# Patient Record
Sex: Male | Born: 1945 | Race: White | Hispanic: No | Smoking: Former smoker
Health system: Southern US, Community
[De-identification: ages and names within clinical notes are randomized; demographics above are authoritative.]

## PROBLEM LIST (undated history)

## (undated) DIAGNOSIS — H269 Unspecified cataract: Secondary | ICD-10-CM

## (undated) DIAGNOSIS — G4730 Sleep apnea, unspecified: Secondary | ICD-10-CM

## (undated) DIAGNOSIS — T884XXA Failed or difficult intubation, initial encounter: Secondary | ICD-10-CM

## (undated) DIAGNOSIS — K319 Disease of stomach and duodenum, unspecified: Secondary | ICD-10-CM

## (undated) DIAGNOSIS — Z5189 Encounter for other specified aftercare: Secondary | ICD-10-CM

## (undated) DIAGNOSIS — I10 Essential (primary) hypertension: Secondary | ICD-10-CM

## (undated) DIAGNOSIS — K5790 Diverticulosis of intestine, part unspecified, without perforation or abscess without bleeding: Secondary | ICD-10-CM

## (undated) DIAGNOSIS — A809 Acute poliomyelitis, unspecified: Secondary | ICD-10-CM

## (undated) DIAGNOSIS — I639 Cerebral infarction, unspecified: Secondary | ICD-10-CM

## (undated) HISTORY — DX: Cerebral infarction, unspecified: I63.9

## (undated) HISTORY — DX: Encounter for other specified aftercare: Z51.89

## (undated) HISTORY — DX: Essential (primary) hypertension: I10

## (undated) HISTORY — PX: KNEE ARTHROSCOPY: SUR90

## (undated) HISTORY — DX: Acute poliomyelitis, unspecified: A80.9

## (undated) HISTORY — PX: COLONOSCOPY: SHX174

## (undated) HISTORY — PX: FRACTURE SURGERY: SHX138

## (undated) HISTORY — DX: Unspecified cataract: H26.9

## (undated) HISTORY — DX: Failed or difficult intubation, initial encounter: T88.4XXA

## (undated) HISTORY — DX: Disease of stomach and duodenum, unspecified: K31.9

## (undated) HISTORY — DX: Diverticulosis of intestine, part unspecified, without perforation or abscess without bleeding: K57.90

## (undated) HISTORY — PX: EYE SURGERY: SHX253

## (undated) HISTORY — DX: Sleep apnea, unspecified: G47.30

## (undated) SURGERY — Surgical Case
Anesthesia: *Unknown

---

## 1998-08-29 ENCOUNTER — Ambulatory Visit (HOSPITAL_COMMUNITY): Admission: RE | Admit: 1998-08-29 | Discharge: 1998-08-29 | Payer: Self-pay | Admitting: Gastroenterology

## 2008-05-17 HISTORY — PX: COLONOSCOPY: SHX174

## 2011-03-18 ENCOUNTER — Other Ambulatory Visit: Payer: Self-pay | Admitting: Cardiology

## 2011-03-18 ENCOUNTER — Encounter: Payer: Self-pay | Admitting: Cardiology

## 2011-05-04 ENCOUNTER — Encounter: Payer: Self-pay | Admitting: Cardiology

## 2011-05-04 ENCOUNTER — Encounter: Payer: Self-pay | Admitting: Cardiovascular Disease

## 2011-05-04 ENCOUNTER — Encounter: Payer: Self-pay | Admitting: *Deleted

## 2011-05-05 ENCOUNTER — Ambulatory Visit (INDEPENDENT_AMBULATORY_CARE_PROVIDER_SITE_OTHER): Payer: BC Managed Care – PPO | Admitting: Cardiology

## 2011-05-05 ENCOUNTER — Encounter: Payer: Self-pay | Admitting: Cardiology

## 2011-05-05 VITALS — BP 142/81 | HR 53 | Wt 237.0 lb

## 2011-05-05 DIAGNOSIS — I10 Essential (primary) hypertension: Secondary | ICD-10-CM

## 2011-05-05 DIAGNOSIS — R42 Dizziness and giddiness: Secondary | ICD-10-CM | POA: Insufficient documentation

## 2011-05-05 DIAGNOSIS — R55 Syncope and collapse: Secondary | ICD-10-CM
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
## 2011-05-05 NOTE — Patient Instructions (Signed)
Your physician recommends that you schedule a follow-up appointment in: 4-6 WEEKS  Your physician has requested that you have an echocardiogram. Echocardiography is a painless test that uses sound waves to create images of your heart. It provides your doctor with information about the size and shape of your heart and how well your heart's chambers and valves are working. This procedure takes approximately one hour. There are no restrictions for this procedure.   Your physician has recommended that you wear an event monitor. Event monitors are medical devices that record the heart's electrical activity. Doctors most often us these monitors to diagnose arrhythmias. Arrhythmias are problems with the speed or rhythm of the heartbeat. The monitor is a small, portable device. You can wear one while you do your normal daily activities. This is usually used to diagnose what is causing palpitations/syncope (passing out).    

## 2011-05-05 NOTE — Assessment & Plan Note (Signed)
Blood pressure controlled. Continue present medications. 

## 2011-05-05 NOTE — Assessment & Plan Note (Signed)
Etiology of dizziness/presyncopal spells unclear. They are transient but no associated symptoms. Plan CardioNet to exclude significant arrhythmia. Echocardiogram to evaluate LV function. If negative he may require neurology evaluation.

## 2011-05-14 ENCOUNTER — Encounter (INDEPENDENT_AMBULATORY_CARE_PROVIDER_SITE_OTHER): Payer: BC Managed Care – PPO

## 2011-05-14 ENCOUNTER — Other Ambulatory Visit: Payer: Self-pay

## 2011-05-14 ENCOUNTER — Ambulatory Visit (HOSPITAL_COMMUNITY): Payer: BC Managed Care – PPO | Attending: Cardiology

## 2011-05-14 DIAGNOSIS — R42 Dizziness and giddiness: Secondary | ICD-10-CM

## 2011-05-14 DIAGNOSIS — I10 Essential (primary) hypertension: Secondary | ICD-10-CM | POA: Insufficient documentation

## 2011-05-14 DIAGNOSIS — R55 Syncope and collapse: Secondary | ICD-10-CM

## 2011-06-08 ENCOUNTER — Telehealth: Payer: Self-pay | Admitting: *Deleted

## 2011-06-08 NOTE — Telephone Encounter (Signed)
Spoke with pt, aware monitor reviewed by dr Jens Som shows sinus with occ PAC.

## 2011-06-28 ENCOUNTER — Encounter: Payer: Self-pay | Admitting: Cardiology

## 2011-06-28 ENCOUNTER — Ambulatory Visit (INDEPENDENT_AMBULATORY_CARE_PROVIDER_SITE_OTHER): Payer: BC Managed Care – PPO | Admitting: Cardiology

## 2011-06-28 VITALS — BP 142/85 | HR 60 | Ht 74.0 in | Wt 237.0 lb

## 2011-06-28 DIAGNOSIS — G459 Transient cerebral ischemic attack, unspecified: Secondary | ICD-10-CM

## 2011-06-28 NOTE — Assessment & Plan Note (Signed)
Continue present BP meds  

## 2011-06-28 NOTE — Patient Instructions (Addendum)
Your physician recommends that you schedule a follow-up appointment in:  As needed Your physician has requested that you have a carotid duplex. This test is an ultrasound of the carotid arteries in your neck. It looks at blood flow through these arteries that supply the brain with blood. Allow one hour for this exam. There are no restrictions or special instructions. Your physician recommends that you continue on your current medications as directed. Please refer to the Current Medication list given to you today.
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
## 2011-06-28 NOTE — Assessment & Plan Note (Signed)
Echo and cardionet unrevealing; will arrange carotid dopplers (?TIA). If carotids unremarkable, will not pursue further cardiac WU. If symptoms recur, may need neurology eval.

## 2011-07-05 ENCOUNTER — Encounter (INDEPENDENT_AMBULATORY_CARE_PROVIDER_SITE_OTHER): Payer: BC Managed Care – PPO

## 2011-07-05 DIAGNOSIS — R42 Dizziness and giddiness: Secondary | ICD-10-CM

## 2011-07-05 DIAGNOSIS — G459 Transient cerebral ischemic attack, unspecified: Secondary | ICD-10-CM

## 2012-01-12 HISTORY — PX: UPPER GASTROINTESTINAL ENDOSCOPY: SHX188

## 2012-09-18 ENCOUNTER — Ambulatory Visit (HOSPITAL_COMMUNITY)
Admission: RE | Admit: 2012-09-18 | Discharge: 2012-09-18 | Disposition: A | Discharge status: Home / Self Care | Payer: Medicare Other | Source: Ambulatory Visit | Attending: Internal Medicine | Admitting: Internal Medicine

## 2012-09-18 ENCOUNTER — Ambulatory Visit (HOSPITAL_COMMUNITY): Admission: RE | Admit: 2012-09-18 | Payer: Medicare Other | Source: Ambulatory Visit

## 2012-09-18 ENCOUNTER — Other Ambulatory Visit (HOSPITAL_COMMUNITY): Payer: Self-pay | Admitting: Internal Medicine

## 2012-09-18 DIAGNOSIS — I6350 Cerebral infarction due to unspecified occlusion or stenosis of unspecified cerebral artery: Secondary | ICD-10-CM | POA: Insufficient documentation

## 2012-09-18 DIAGNOSIS — R202 Paresthesia of skin: Secondary | ICD-10-CM

## 2012-09-18 DIAGNOSIS — Z8673 Personal history of transient ischemic attack (TIA), and cerebral infarction without residual deficits: Secondary | ICD-10-CM | POA: Insufficient documentation

## 2012-09-18 DIAGNOSIS — R209 Unspecified disturbances of skin sensation: Secondary | ICD-10-CM | POA: Insufficient documentation

## 2012-09-18 DIAGNOSIS — R29898 Other symptoms and signs involving the musculoskeletal system: Secondary | ICD-10-CM | POA: Insufficient documentation

## 2012-09-22 ENCOUNTER — Telehealth: Payer: Self-pay | Admitting: Internal Medicine

## 2012-09-22 NOTE — Telephone Encounter (Signed)
Spoke with Marchelle Folks and patient did see Dr. Sherin Quarry in the past. She will sent [place] his records. Scheduled with Doug Sou, PA on 09/26/12 at 10:00 AM.

## 2012-09-26 ENCOUNTER — Ambulatory Visit (INDEPENDENT_AMBULATORY_CARE_PROVIDER_SITE_OTHER): Payer: Medicare Other | Admitting: Gastroenterology

## 2012-09-26 ENCOUNTER — Other Ambulatory Visit (INDEPENDENT_AMBULATORY_CARE_PROVIDER_SITE_OTHER): Payer: Medicare Other

## 2012-09-26 ENCOUNTER — Telehealth: Payer: Self-pay | Admitting: *Deleted

## 2012-09-26 ENCOUNTER — Encounter: Payer: Self-pay | Admitting: Gastroenterology

## 2012-09-26 ENCOUNTER — Encounter: Payer: Self-pay | Admitting: Internal Medicine

## 2012-09-26 VITALS — BP 128/64 | HR 60 | Ht 74.0 in | Wt 226.0 lb

## 2012-09-26 DIAGNOSIS — K921 Melena: Secondary | ICD-10-CM | POA: Insufficient documentation

## 2012-09-26 DIAGNOSIS — R195 Other fecal abnormalities: Secondary | ICD-10-CM

## 2012-09-26 LAB — CBC WITH DIFFERENTIAL/PLATELET
Basophils Relative: 0.7 % (ref 0.0–3.0)
Eosinophils Absolute: 0.1 10*3/uL (ref 0.0–0.7)
Eosinophils Relative: 2.3 % (ref 0.0–5.0)
Lymphocytes Relative: 26.3 % (ref 12.0–46.0)
Neutrophils Relative %: 61.1 % (ref 43.0–77.0)
Platelets: 235 10*3/uL (ref 150.0–400.0)
RBC: 3.7 Mil/uL — ABNORMAL LOW (ref 4.22–5.81)
WBC: 4.7 10*3/uL (ref 4.5–10.5)
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
## 2012-09-26 NOTE — Progress Notes (Signed)
Reviewed. Pt needs a rectal exam to test  "black" stool. Also BUN/ creatinine to assess activity of the bleeding. EGD scheduled  In next ? few days?

## 2012-09-26 NOTE — Patient Instructions (Addendum)
  Your physician has requested that you go to the basement for the following lab work before leaving today:  CBC  You have been scheduled for an endoscopy. Please follow written instructions given to you at your visit today. If you use inhalers (even only as needed), please bring them with you on the day of your procedure. Your physician has requested that you go to www.startemmi.com and enter the access code given to you at your visit today. This web site gives a general overview about your procedure. However, you should still follow specific instructions given to you by our office regarding your preparation for the procedure.  
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
## 2012-09-27 NOTE — Telephone Encounter (Signed)
Patient given appointment date and time. Reviewed prep instructions and gave him the time changes. Patient verbalizes understanding.

## 2012-09-27 NOTE — Interval H&P Note (Signed)
History and Physical Interval Note:  09/27/2012 9:15 PM  Marvin Zamora  has presented today for surgery, with the diagnosis of black stool  The various methods of treatment have been discussed with the patient and family. After consideration of risks, benefits and other options for treatment, the patient has consented to  Procedure(s): ESOPHAGOGASTRODUODENOSCOPY (EGD) (N/A) as a surgical intervention .  The patient's history has been reviewed, patient examined, no change in status, stable for surgery.  I have reviewed the patient's chart and labs.  Questions were answered to the patient's satisfaction.     Lina Sar

## 2012-09-27 NOTE — Telephone Encounter (Signed)
Scheduled EGD on 09/28/12 at 12:30 PM at [facility] endo(Jill). Left a message for patient to call me.

## 2012-09-28 ENCOUNTER — Encounter (HOSPITAL_COMMUNITY): Payer: Self-pay | Admitting: *Deleted

## 2012-09-28 ENCOUNTER — Encounter (HOSPITAL_COMMUNITY)
Admission: RE | Disposition: A | Discharge status: Home / Self Care | Payer: Self-pay | Source: Ambulatory Visit | Attending: Internal Medicine

## 2012-09-28 ENCOUNTER — Ambulatory Visit (HOSPITAL_COMMUNITY)
Admission: RE | Admit: 2012-09-28 | Discharge: 2012-09-28 | Disposition: A | Discharge status: Home / Self Care | Payer: Medicare Other | Source: Ambulatory Visit | Attending: Internal Medicine | Admitting: Internal Medicine

## 2012-09-28 DIAGNOSIS — K2980 Duodenitis without bleeding: Secondary | ICD-10-CM | POA: Insufficient documentation

## 2012-09-28 DIAGNOSIS — Z8673 Personal history of transient ischemic attack (TIA), and cerebral infarction without residual deficits: Secondary | ICD-10-CM | POA: Insufficient documentation

## 2012-09-28 DIAGNOSIS — K921 Melena: Secondary | ICD-10-CM

## 2012-09-28 DIAGNOSIS — Z8612 Personal history of poliomyelitis: Secondary | ICD-10-CM | POA: Insufficient documentation

## 2012-09-28 DIAGNOSIS — K319 Disease of stomach and duodenum, unspecified: Secondary | ICD-10-CM | POA: Insufficient documentation

## 2012-09-28 DIAGNOSIS — Z79899 Other long term (current) drug therapy: Secondary | ICD-10-CM | POA: Insufficient documentation

## 2012-09-28 DIAGNOSIS — Z7982 Long term (current) use of aspirin: Secondary | ICD-10-CM | POA: Insufficient documentation

## 2012-09-28 DIAGNOSIS — I10 Essential (primary) hypertension: Secondary | ICD-10-CM | POA: Insufficient documentation

## 2012-09-28 DIAGNOSIS — R195 Other fecal abnormalities: Secondary | ICD-10-CM

## 2012-09-28 HISTORY — PX: ESOPHAGOGASTRODUODENOSCOPY: SHX5428

## 2012-09-28 SURGERY — EGD (ESOPHAGOGASTRODUODENOSCOPY)
Anesthesia: Moderate Sedation

## 2012-09-28 MED ORDER — OMEPRAZOLE 40 MG PO CPDR: 40.0000 mg | DELAYED_RELEASE_CAPSULE | Freq: Every day | ORAL | Status: DC

## 2012-09-28 MED ORDER — BUTAMBEN-TETRACAINE-BENZOCAINE 2-2-14 % EX AERO
INHALATION_SPRAY | CUTANEOUS | Status: DC | PRN
  Administered 2012-09-28: 2 via TOPICAL

## 2012-09-28 MED ORDER — FENTANYL CITRATE 0.05 MG/ML IJ SOLN
INTRAMUSCULAR | Status: DC | PRN
  Administered 2012-09-28 (×2): 25 ug via INTRAVENOUS

## 2012-09-28 MED ORDER — MIDAZOLAM HCL 10 MG/2ML IJ SOLN
INTRAMUSCULAR | Status: AC
  Filled 2012-09-28: qty 2

## 2012-09-28 MED ORDER — FENTANYL CITRATE 0.05 MG/ML IJ SOLN
INTRAMUSCULAR | Status: AC
  Filled 2012-09-28: qty 2

## 2012-09-28 MED ORDER — MIDAZOLAM HCL 10 MG/2ML IJ SOLN
INTRAMUSCULAR | Status: DC | PRN
  Administered 2012-09-28 (×3): 2 mg via INTRAVENOUS

## 2012-09-28 MED ORDER — DIPHENHYDRAMINE HCL 50 MG/ML IJ SOLN
INTRAMUSCULAR | Status: AC
  Filled 2012-09-28: qty 1

## 2012-09-28 MED ORDER — SODIUM CHLORIDE 0.9 % IV SOLN
INTRAVENOUS | Status: DC
  Administered 2012-09-28: 12:00:00 via INTRAVENOUS

## 2012-09-28 NOTE — Op Note (Addendum)
[facility] [address] [place], [address]   ENDOSCOPY PROCEDURE REPORT  PATIENT: Marvin Zamora, Marvin Zamora  MR#: [id number] BIRTHDATE: [birth year] , 67  yrs. old GENDER: Male ENDOSCOPIST: Hart Carwin, MD REFERRED BY:  Rodrigo Ran, M.D. PROCEDURE DATE:  09/28/2012 PROCEDURE:  EGD w/ biopsy ASA CLASS:     Class III INDICATIONS:  reported melena, drop in Hgb  to 11.6 ,was on ASA for stroke prevention, then had a CVA, MEDICATIONS: MAC sedation, administered by CRNA, Versed 5 mg IV, and Propofol (Diprivan) 60 mcg IV TOPICAL ANESTHETIC: Cetacaine Spray  DESCRIPTION OF PROCEDURE: After the risks benefits and alternatives of the procedure were thoroughly explained, informed consent was obtained.  The Pentax Gastroscope [id number] endoscope was introduced through the mouth and advanced to the second portion of the duodenum. Without limitations.  The instrument was slowly withdrawn as the mucosa was fully examined.      Esophagus: proximal mid and distal esophageal mucosa appeared normal. Was mild friability at the GE junction consistent with grade esophagitis there was a mild constriction but no significant stricture. The lumen was slightly eccentric. There was no significant hiatal hernia Stomach: Body of the stomach appeared normal. There were multiple pinpoint erosions in gastric antrum with specks of coffee-ground material. Gastric outlet was normal. Retroflexion of the endoscope revealed normal fundus and cardia. There was no blood in the stomach. Biopsies were taken from the gastric antrum to rule out H. pylori gastropathy  Duodenum: There were multiple superficial erosions in the duodenal bulb as well as descending duodenum consistent with mild inflammation. Biopsies were taken[         The scope was then withdrawn from the patient and the procedure completed.  COMPLICATIONS: There were no complications. ENDOSCOPIC IMPRESSION: #1 grade 1 esophagitis status  post biopsies to rule out esophagitis #2 mild antral gastritis likely secondary to aspirin. No active bleeding. Biopsies taken to rule out H. pylori #3 mild duodenitis. 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 I have discussed this with Dr. Waynard Edwards and we agreed that patient needs to continue aspirin for stroke prevention while covered by PPI on a long-term basis. He will have his hemoglobin rechecked in 6-8 weeks and followup with Dr. Waynard Edwards He is up-to-date on the colonoscopy which was done in 2011. RECOMMENDATIONS: Await biopsy results Prilosec 40 mg po qd H/H 6-7 weeks in Dr Perini's office  REPEAT EXAM: no recall  eSigned:  Hart Carwin, MD 09/28/2012 2:02 PM Revised: 09/28/2012 2:02 PM  CC:  PATIENT NAME:  Marvin Zamora, Marvin Zamora MR#: [id number]

## 2012-09-28 NOTE — Interval H&P Note (Signed)
History and Physical Interval Note:  09/28/2012 1:07 PM  Marvin Zamora  has presented today for surgery, with the diagnosis of black stool  The various methods of treatment have been discussed with the patient and family. After consideration of risks, benefits and other options for treatment, the patient has consented to  Procedure(s): ESOPHAGOGASTRODUODENOSCOPY (EGD) (N/A) as a surgical intervention .  The patient's history has been reviewed, patient examined, no change in status, stable for surgery.  I have reviewed the patient's chart and labs.  Questions were answered to the patient's satisfaction.     Lina Sar

## 2012-09-29 ENCOUNTER — Encounter (HOSPITAL_COMMUNITY): Payer: Self-pay | Admitting: Internal Medicine

## 2012-09-29 ENCOUNTER — Encounter: Payer: Self-pay | Admitting: Internal Medicine

## 2012-10-10 ENCOUNTER — Ambulatory Visit (INDEPENDENT_AMBULATORY_CARE_PROVIDER_SITE_OTHER): Payer: Medicare Other | Admitting: Neurology

## 2012-10-10 ENCOUNTER — Encounter: Payer: Self-pay | Admitting: Neurology

## 2012-10-10 VITALS — BP 128/69 | HR 48 | Temp 97.9°F | Ht 72.25 in | Wt 223.0 lb

## 2012-10-10 DIAGNOSIS — R209 Unspecified disturbances of skin sensation: Secondary | ICD-10-CM

## 2012-10-10 DIAGNOSIS — Z87820 Personal history of traumatic brain injury: Secondary | ICD-10-CM | POA: Insufficient documentation

## 2012-10-10 DIAGNOSIS — G510 Bell's palsy: Secondary | ICD-10-CM

## 2012-10-10 DIAGNOSIS — I6350 Cerebral infarction due to unspecified occlusion or stenosis of unspecified cerebral artery: Secondary | ICD-10-CM | POA: Insufficient documentation

## 2012-10-10 NOTE — Patient Instructions (Addendum)
Continue aspirin for stroke prevention and strict control of hypertension with blood pressure goal below 130/90 and lipids with LDL cholesterol goal below 100 mg percent. 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 Return for followup in 6 weeks
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
## 2012-10-23 ENCOUNTER — Ambulatory Visit
Admission: RE | Admit: 2012-10-23 | Discharge: 2012-10-23 | Disposition: A | Discharge status: Home / Self Care | Payer: Medicare Other | Source: Ambulatory Visit | Attending: Neurology | Admitting: Neurology

## 2012-10-23 DIAGNOSIS — I6350 Cerebral infarction due to unspecified occlusion or stenosis of unspecified cerebral artery: Secondary | ICD-10-CM

## 2012-10-23 MED ORDER — GADOBENATE DIMEGLUMINE 529 MG/ML IV SOLN
20.0000 mL | Freq: Once | INTRAVENOUS | Status: AC | PRN
  Administered 2012-10-23: 20 mL via INTRAVENOUS

## 2012-10-31 ENCOUNTER — Other Ambulatory Visit (HOSPITAL_COMMUNITY): Payer: Self-pay | Admitting: Neurology

## 2012-10-31 ENCOUNTER — Ambulatory Visit (HOSPITAL_COMMUNITY): Payer: Medicare Other | Attending: Cardiology | Admitting: Radiology

## 2012-10-31 DIAGNOSIS — I059 Rheumatic mitral valve disease, unspecified: Secondary | ICD-10-CM | POA: Insufficient documentation

## 2012-10-31 DIAGNOSIS — I639 Cerebral infarction, unspecified: Secondary | ICD-10-CM

## 2012-10-31 DIAGNOSIS — I10 Essential (primary) hypertension: Secondary | ICD-10-CM | POA: Insufficient documentation

## 2012-10-31 DIAGNOSIS — Z87891 Personal history of nicotine dependence: Secondary | ICD-10-CM | POA: Insufficient documentation

## 2012-10-31 DIAGNOSIS — I6789 Other cerebrovascular disease: Secondary | ICD-10-CM

## 2012-10-31 DIAGNOSIS — I079 Rheumatic tricuspid valve disease, unspecified: Secondary | ICD-10-CM | POA: Insufficient documentation

## 2012-10-31 DIAGNOSIS — I6350 Cerebral infarction due to unspecified occlusion or stenosis of unspecified cerebral artery: Secondary | ICD-10-CM

## 2012-10-31 NOTE — Progress Notes (Signed)
Echocardiogram performed.  

## 2012-11-22 ENCOUNTER — Encounter (INDEPENDENT_AMBULATORY_CARE_PROVIDER_SITE_OTHER): Payer: Self-pay

## 2012-11-22 ENCOUNTER — Encounter: Payer: Self-pay | Admitting: Neurology

## 2012-11-22 ENCOUNTER — Ambulatory Visit (INDEPENDENT_AMBULATORY_CARE_PROVIDER_SITE_OTHER): Payer: Medicare Other | Admitting: Neurology

## 2012-11-22 VITALS — BP 123/69 | HR 58 | Temp 97.6°F | Ht 73.0 in | Wt 229.0 lb

## 2012-11-22 DIAGNOSIS — I6350 Cerebral infarction due to unspecified occlusion or stenosis of unspecified cerebral artery: Secondary | ICD-10-CM

## 2012-11-22 NOTE — Patient Instructions (Signed)
Continue aspirin for stroke prevention and strict control of hypertension with blood pressure goal below 130/90 and lipids with LDL cholesterol goal below 100 mg percent. Continue followup with Dr. Waynard Edwards as patient does not want to follow up with me unless he has new symptoms.
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
## 2014-04-18 ENCOUNTER — Encounter: Payer: Self-pay | Admitting: Internal Medicine

## 2014-04-29 ENCOUNTER — Telehealth: Payer: Self-pay | Admitting: *Deleted

## 2014-04-29 NOTE — Telephone Encounter (Signed)
Dr Olevia Perches,  this patient is scheduled for a colon with you 5-4 Wednesday at 0830 am. Says recall colon. Last colon was 05-17-2008 at [facility] with Sammuel Cooper and he had tics, mild tourtosity but no stricture and the report states recall in 10 years (  2020).  He had an egd with you 09-18-2012.  No other visits in the office. Is he okay to proceed with his colon now or should he wait until 2020 as indicated.  Please advise   Thanks for your time.  Lelan Pons PV

## 2014-04-29 NOTE — Telephone Encounter (Signed)
I am not sure how he got scheduled. Please set up an appointment with me. He was anemic, we need to recheck if heme positive.

## 2014-04-30 NOTE — Telephone Encounter (Signed)
Attempted to reach pt; unable to

## 2014-04-30 NOTE — Telephone Encounter (Signed)
Dr. Olevia Perches, [place] do not have any available OV until the end of May.  Would he be ok to see an extender?  Thanks, [employer]

## 2014-04-30 NOTE — Telephone Encounter (Signed)
Attempted to reach pt- home phone disconnected and mobile phone voicemail not set up yet

## 2014-04-30 NOTE — Telephone Encounter (Signed)
It is OK for him to wait till end of May, his colonoscopy  Recall may be changed to  10 years, so there is no rush. If he feels in a rush then it is OK for him to see an extender.

## 2014-05-01 NOTE — Telephone Encounter (Signed)
Phoned patient. Informed him of need for office visit with Dr. Olevia Perches, appointment available and made 05/03/14 at 145 pm.

## 2014-05-02 ENCOUNTER — Encounter: Payer: Self-pay | Admitting: *Deleted

## 2014-05-03 ENCOUNTER — Encounter: Payer: Self-pay | Admitting: Internal Medicine

## 2014-05-03 ENCOUNTER — Ambulatory Visit (INDEPENDENT_AMBULATORY_CARE_PROVIDER_SITE_OTHER): Payer: Medicare HMO | Admitting: Internal Medicine

## 2014-05-03 VITALS — BP 120/70 | HR 72 | Ht 73.0 in | Wt 241.0 lb

## 2014-05-03 DIAGNOSIS — Z1211 Encounter for screening for malignant neoplasm of colon: Secondary | ICD-10-CM

## 2014-05-03 NOTE — Patient Instructions (Addendum)
You will be due for a recall colonoscopy in 05/2018. We will send you a reminder in the mail when it gets closer to that time.  We will send your 2010 colonoscopy  report to Dr Joylene Draft.
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
## 2014-05-15 ENCOUNTER — Encounter: Payer: Self-pay | Admitting: Internal Medicine

## 2015-03-31 DIAGNOSIS — H26491 Other secondary cataract, right eye: Secondary | ICD-10-CM | POA: Diagnosis not present

## 2015-03-31 DIAGNOSIS — H401221 Low-tension glaucoma, left eye, mild stage: Secondary | ICD-10-CM | POA: Diagnosis not present

## 2015-04-10 DIAGNOSIS — H26491 Other secondary cataract, right eye: Secondary | ICD-10-CM | POA: Diagnosis not present

## 2015-05-29 DIAGNOSIS — I10 Essential (primary) hypertension: Secondary | ICD-10-CM | POA: Diagnosis not present

## 2015-05-29 DIAGNOSIS — Z125 Encounter for screening for malignant neoplasm of prostate: Secondary | ICD-10-CM | POA: Diagnosis not present

## 2015-05-29 DIAGNOSIS — R7301 Impaired fasting glucose: Secondary | ICD-10-CM | POA: Diagnosis not present

## 2015-06-05 ENCOUNTER — Telehealth: Payer: Self-pay | Admitting: *Deleted

## 2015-06-05 DIAGNOSIS — Z6828 Body mass index (BMI) 28.0-28.9, adult: Secondary | ICD-10-CM | POA: Diagnosis not present

## 2015-06-05 DIAGNOSIS — Z0000 Encounter for general adult medical examination without abnormal findings: Secondary | ICD-10-CM | POA: Diagnosis not present

## 2015-06-05 DIAGNOSIS — J309 Allergic rhinitis, unspecified: Secondary | ICD-10-CM | POA: Diagnosis not present

## 2015-06-05 DIAGNOSIS — H547 Unspecified visual loss: Secondary | ICD-10-CM | POA: Diagnosis not present

## 2015-06-05 DIAGNOSIS — H409 Unspecified glaucoma: Secondary | ICD-10-CM | POA: Diagnosis not present

## 2015-06-05 DIAGNOSIS — Z1389 Encounter for screening for other disorder: Secondary | ICD-10-CM | POA: Diagnosis not present

## 2015-06-05 DIAGNOSIS — H9190 Unspecified hearing loss, unspecified ear: Secondary | ICD-10-CM | POA: Diagnosis not present

## 2015-06-05 DIAGNOSIS — I10 Essential (primary) hypertension: Secondary | ICD-10-CM | POA: Diagnosis not present

## 2015-06-05 DIAGNOSIS — K2970 Gastritis, unspecified, without bleeding: Secondary | ICD-10-CM | POA: Diagnosis not present

## 2015-06-05 DIAGNOSIS — E669 Obesity, unspecified: Secondary | ICD-10-CM | POA: Diagnosis not present

## 2015-06-05 DIAGNOSIS — Z1212 Encounter for screening for malignant neoplasm of rectum: Secondary | ICD-10-CM | POA: Diagnosis not present

## 2015-06-05 DIAGNOSIS — I69959 Hemiplegia and hemiparesis following unspecified cerebrovascular disease affecting unspecified side: Secondary | ICD-10-CM | POA: Diagnosis not present

## 2015-06-05 DIAGNOSIS — M25551 Pain in right hip: Secondary | ICD-10-CM | POA: Diagnosis not present

## 2015-06-05 NOTE — Telephone Encounter (Signed)
Message For: LINDA                Taken 25-MAY-17 at  2:03PM by [facility] ------------------------------------------------------------ Cindie Laroche Stanislawski              CID [id number]  Patient SAME                 Pt's Dr Reola Calkins PATIENT  Area Code 336 Phone# [address] OF        HIM APPT                                             Disp:Y/N N If Y = C/B If No Response In 53minutes ============================================================

## 2015-06-23 ENCOUNTER — Institutional Professional Consult (permissible substitution): Payer: PPO | Admitting: Neurology

## 2015-06-23 ENCOUNTER — Telehealth: Payer: Self-pay

## 2015-06-23 NOTE — Telephone Encounter (Signed)
I called pt to reschedule his appt today because Dr. Brett Fairy is out sick. Pt is agreeable to coming in on 07/02/15 at 1:00. Pt verbalized understanding.

## 2015-07-02 ENCOUNTER — Ambulatory Visit (INDEPENDENT_AMBULATORY_CARE_PROVIDER_SITE_OTHER): Payer: PPO | Admitting: Neurology

## 2015-07-02 ENCOUNTER — Encounter: Payer: Self-pay | Admitting: Neurology

## 2015-07-02 VITALS — BP 150/90 | HR 68 | Resp 20 | Ht 73.0 in | Wt 226.0 lb

## 2015-07-02 DIAGNOSIS — R0683 Snoring: Secondary | ICD-10-CM | POA: Diagnosis not present

## 2015-07-02 DIAGNOSIS — S0292XS Unspecified fracture of facial bones, sequela: Secondary | ICD-10-CM

## 2015-07-02 DIAGNOSIS — I63039 Cerebral infarction due to thrombosis of unspecified carotid artery: Secondary | ICD-10-CM

## 2015-07-02 DIAGNOSIS — S0291XS Unspecified fracture of skull, sequela: Secondary | ICD-10-CM
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
## 2015-07-23 ENCOUNTER — Ambulatory Visit (INDEPENDENT_AMBULATORY_CARE_PROVIDER_SITE_OTHER): Payer: PPO | Admitting: Neurology

## 2015-07-23 DIAGNOSIS — G4733 Obstructive sleep apnea (adult) (pediatric): Secondary | ICD-10-CM | POA: Diagnosis not present

## 2015-07-23 DIAGNOSIS — I63039 Cerebral infarction due to thrombosis of unspecified carotid artery: Secondary | ICD-10-CM

## 2015-07-23 DIAGNOSIS — S0292XS Unspecified fracture of facial bones, sequela: Secondary | ICD-10-CM

## 2015-07-23 DIAGNOSIS — R0683 Snoring: Secondary | ICD-10-CM

## 2015-07-23 DIAGNOSIS — S0291XS Unspecified fracture of skull, sequela: Secondary | ICD-10-CM

## 2015-08-04 ENCOUNTER — Telehealth: Payer: Self-pay

## 2015-08-04 NOTE — Telephone Encounter (Signed)
I spoke to pt. I advised him that his sleep study results showed moderate severe osa with hypoxemia. CPAP appeared to be effective. Pt says that he is unsure about starting cpap at this time and would prefer a follow up with Dr. Brett Fairy to discuss. An appt was made for 7/31 with Dr. Brett Fairy. Pt verbalized understanding of results. Pt had no questions at this time but was encouraged to call back if questions arise.

## 2015-08-11 ENCOUNTER — Encounter: Payer: Self-pay | Admitting: Neurology

## 2015-08-11 ENCOUNTER — Telehealth: Payer: Self-pay

## 2015-08-11 ENCOUNTER — Ambulatory Visit (INDEPENDENT_AMBULATORY_CARE_PROVIDER_SITE_OTHER): Payer: PPO | Admitting: Neurology

## 2015-08-11 VITALS — BP 140/82 | HR 60 | Resp 20 | Ht 73.0 in | Wt 235.0 lb

## 2015-08-11 DIAGNOSIS — R0683 Snoring: Secondary | ICD-10-CM | POA: Diagnosis not present

## 2015-08-11 DIAGNOSIS — G4733 Obstructive sleep apnea (adult) (pediatric): Secondary | ICD-10-CM

## 2015-08-11 NOTE — Telephone Encounter (Signed)
Dr. Brett Fairy did not order the cpap correctly (no settings for the cpap, DME will reject order with no settings). Received verbal confirmation that she wants pt on cpap 9 cm H2O.  Order entered correctly and will send to [place].

## 2015-08-11 NOTE — Patient Instructions (Signed)
RV in 60-90 days on CPAP , daily 4 hours or more use needed.
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
## 2015-08-27 DIAGNOSIS — G4733 Obstructive sleep apnea (adult) (pediatric): Secondary | ICD-10-CM | POA: Diagnosis not present

## 2015-09-25 DIAGNOSIS — L821 Other seborrheic keratosis: Secondary | ICD-10-CM | POA: Diagnosis not present

## 2015-09-25 DIAGNOSIS — D1801 Hemangioma of skin and subcutaneous tissue: Secondary | ICD-10-CM | POA: Diagnosis not present

## 2015-09-25 DIAGNOSIS — D225 Melanocytic nevi of trunk: Secondary | ICD-10-CM | POA: Diagnosis not present

## 2015-09-25 DIAGNOSIS — L570 Actinic keratosis: Secondary | ICD-10-CM | POA: Diagnosis not present

## 2015-09-25 DIAGNOSIS — D2371 Other benign neoplasm of skin of right lower limb, including hip: Secondary | ICD-10-CM | POA: Diagnosis not present

## 2015-09-25 DIAGNOSIS — L723 Sebaceous cyst: Secondary | ICD-10-CM | POA: Diagnosis not present

## 2015-09-25 DIAGNOSIS — D2372 Other benign neoplasm of skin of left lower limb, including hip: Secondary | ICD-10-CM | POA: Diagnosis not present

## 2015-09-27 DIAGNOSIS — G4733 Obstructive sleep apnea (adult) (pediatric): Secondary | ICD-10-CM | POA: Diagnosis not present

## 2015-10-11 DIAGNOSIS — Z23 Encounter for immunization: Secondary | ICD-10-CM | POA: Diagnosis not present

## 2015-10-27 DIAGNOSIS — H401221 Low-tension glaucoma, left eye, mild stage: Secondary | ICD-10-CM | POA: Diagnosis not present

## 2015-10-27 DIAGNOSIS — G4733 Obstructive sleep apnea (adult) (pediatric): Secondary | ICD-10-CM | POA: Diagnosis not present

## 2015-10-29 ENCOUNTER — Encounter: Payer: Self-pay | Admitting: Neurology

## 2015-11-03 ENCOUNTER — Ambulatory Visit (INDEPENDENT_AMBULATORY_CARE_PROVIDER_SITE_OTHER): Payer: PPO | Admitting: Neurology

## 2015-11-03 ENCOUNTER — Encounter: Payer: Self-pay | Admitting: Neurology

## 2015-11-03 VITALS — BP 164/72 | HR 68 | Resp 20 | Ht 74.0 in | Wt 245.0 lb

## 2015-11-03 DIAGNOSIS — G4733 Obstructive sleep apnea (adult) (pediatric): Secondary | ICD-10-CM | POA: Diagnosis not present

## 2015-11-03 DIAGNOSIS — Z9989 Dependence on other enabling machines and devices: Secondary | ICD-10-CM | POA: Diagnosis not present
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
## 2015-11-03 NOTE — Patient Instructions (Signed)

## 2015-11-05 DIAGNOSIS — R197 Diarrhea, unspecified: Secondary | ICD-10-CM | POA: Diagnosis not present

## 2015-11-05 DIAGNOSIS — R0602 Shortness of breath: Secondary | ICD-10-CM | POA: Diagnosis not present

## 2015-11-05 DIAGNOSIS — K5792 Diverticulitis of intestine, part unspecified, without perforation or abscess without bleeding: Secondary | ICD-10-CM | POA: Diagnosis not present

## 2015-11-05 DIAGNOSIS — R1032 Left lower quadrant pain: Secondary | ICD-10-CM | POA: Diagnosis not present

## 2015-11-05 DIAGNOSIS — Z6831 Body mass index (BMI) 31.0-31.9, adult: Secondary | ICD-10-CM | POA: Diagnosis not present

## 2016-01-27 DIAGNOSIS — G4733 Obstructive sleep apnea (adult) (pediatric): Secondary | ICD-10-CM | POA: Diagnosis not present

## 2016-02-18 ENCOUNTER — Telehealth: Payer: Self-pay | Admitting: Neurology

## 2016-02-18 NOTE — Telephone Encounter (Signed)
Patient called in reference to CPAP provider [facility] is not in network with patients insurance.  Patient would like to switch to something that is in network with his insurance please either [facility] or [facility].  Please call

## 2016-02-18 NOTE — Telephone Encounter (Signed)
I sent Heather with [facility] a message to check the status on this.

## 2016-02-25 NOTE — Telephone Encounter (Signed)
No response yet, I have sent another email to get the status.

## 2016-02-25 NOTE — Telephone Encounter (Signed)
This is the email that I got back from [place] at [facility]:  Marvin Zamora,  He does have a Cpap with [place] and we are in network with his insurance.  I think some supplies were put in under the wrong location for billing but that was corrected.

## 2016-02-27 DIAGNOSIS — G4733 Obstructive sleep apnea (adult) (pediatric): Secondary | ICD-10-CM | POA: Diagnosis not present

## 2016-03-26 DIAGNOSIS — G4733 Obstructive sleep apnea (adult) (pediatric): Secondary | ICD-10-CM | POA: Diagnosis not present

## 2016-03-26 DIAGNOSIS — Z1212 Encounter for screening for malignant neoplasm of rectum: Secondary | ICD-10-CM | POA: Diagnosis not present

## 2016-04-26 DIAGNOSIS — G4733 Obstructive sleep apnea (adult) (pediatric): Secondary | ICD-10-CM | POA: Diagnosis not present

## 2016-04-27 DIAGNOSIS — H26492 Other secondary cataract, left eye: Secondary | ICD-10-CM | POA: Diagnosis not present

## 2016-04-27 DIAGNOSIS — H524 Presbyopia: Secondary | ICD-10-CM | POA: Diagnosis not present

## 2016-04-27 DIAGNOSIS — H401221 Low-tension glaucoma, left eye, mild stage: Secondary | ICD-10-CM | POA: Diagnosis not present

## 2016-04-27 DIAGNOSIS — H43812 Vitreous degeneration, left eye: Secondary | ICD-10-CM | POA: Diagnosis not present

## 2016-05-03 ENCOUNTER — Ambulatory Visit: Payer: PPO | Admitting: Neurology

## 2016-05-17 ENCOUNTER — Encounter: Payer: Self-pay | Admitting: Neurology

## 2016-05-17 ENCOUNTER — Ambulatory Visit (INDEPENDENT_AMBULATORY_CARE_PROVIDER_SITE_OTHER): Payer: PPO | Admitting: Neurology

## 2016-05-17 VITALS — BP 172/73 | HR 56 | Ht 73.0 in | Wt 238.0 lb

## 2016-05-17 DIAGNOSIS — G4733 Obstructive sleep apnea (adult) (pediatric): Secondary | ICD-10-CM | POA: Diagnosis not present

## 2016-05-17 DIAGNOSIS — Z9989 Dependence on other enabling machines and devices: Secondary | ICD-10-CM
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
## 2016-05-17 NOTE — Patient Instructions (Signed)

## 2016-05-20 DIAGNOSIS — H26492 Other secondary cataract, left eye: Secondary | ICD-10-CM | POA: Diagnosis not present

## 2016-05-26 DIAGNOSIS — G4733 Obstructive sleep apnea (adult) (pediatric): Secondary | ICD-10-CM | POA: Diagnosis not present

## 2016-06-25 DIAGNOSIS — K5792 Diverticulitis of intestine, part unspecified, without perforation or abscess without bleeding: Secondary | ICD-10-CM | POA: Diagnosis not present

## 2016-06-25 DIAGNOSIS — Z6830 Body mass index (BMI) 30.0-30.9, adult: Secondary | ICD-10-CM | POA: Diagnosis not present

## 2016-06-26 DIAGNOSIS — G4733 Obstructive sleep apnea (adult) (pediatric): Secondary | ICD-10-CM | POA: Diagnosis not present

## 2016-06-30 DIAGNOSIS — I10 Essential (primary) hypertension: Secondary | ICD-10-CM | POA: Diagnosis not present

## 2016-06-30 DIAGNOSIS — R7301 Impaired fasting glucose: Secondary | ICD-10-CM | POA: Diagnosis not present

## 2016-06-30 DIAGNOSIS — Z125 Encounter for screening for malignant neoplasm of prostate: Secondary | ICD-10-CM | POA: Diagnosis not present

## 2016-07-07 DIAGNOSIS — K5792 Diverticulitis of intestine, part unspecified, without perforation or abscess without bleeding: Secondary | ICD-10-CM | POA: Diagnosis not present

## 2016-07-07 DIAGNOSIS — I638 Other cerebral infarction: Secondary | ICD-10-CM | POA: Diagnosis not present

## 2016-07-07 DIAGNOSIS — Z6832 Body mass index (BMI) 32.0-32.9, adult: Secondary | ICD-10-CM | POA: Diagnosis not present

## 2016-07-07 DIAGNOSIS — K2970 Gastritis, unspecified, without bleeding: Secondary | ICD-10-CM | POA: Diagnosis not present

## 2016-07-07 DIAGNOSIS — I69959 Hemiplegia and hemiparesis following unspecified cerebrovascular disease affecting unspecified side: Secondary | ICD-10-CM | POA: Diagnosis not present

## 2016-07-07 DIAGNOSIS — Z0000 Encounter for general adult medical examination without abnormal findings: Secondary | ICD-10-CM | POA: Diagnosis not present

## 2016-07-07 DIAGNOSIS — E668 Other obesity: Secondary | ICD-10-CM | POA: Diagnosis not present

## 2016-07-07 DIAGNOSIS — G4733 Obstructive sleep apnea (adult) (pediatric): Secondary | ICD-10-CM | POA: Diagnosis not present

## 2016-07-07 DIAGNOSIS — Z1389 Encounter for screening for other disorder: Secondary | ICD-10-CM | POA: Diagnosis not present

## 2016-07-07 DIAGNOSIS — N528 Other male erectile dysfunction: Secondary | ICD-10-CM | POA: Diagnosis not present

## 2016-07-07 DIAGNOSIS — R7301 Impaired fasting glucose: Secondary | ICD-10-CM | POA: Diagnosis not present

## 2016-07-07 DIAGNOSIS — K1370 Unspecified lesions of oral mucosa: Secondary | ICD-10-CM | POA: Diagnosis not present

## 2016-07-08 DIAGNOSIS — Z1212 Encounter for screening for malignant neoplasm of rectum: Secondary | ICD-10-CM | POA: Diagnosis not present

## 2016-07-26 DIAGNOSIS — G4733 Obstructive sleep apnea (adult) (pediatric): Secondary | ICD-10-CM | POA: Diagnosis not present

## 2016-08-13 ENCOUNTER — Encounter: Payer: Self-pay | Admitting: Neurology

## 2016-08-13 DIAGNOSIS — G4733 Obstructive sleep apnea (adult) (pediatric): Secondary | ICD-10-CM

## 2016-08-13 DIAGNOSIS — Z9989 Dependence on other enabling machines and devices: Principal | ICD-10-CM

## 2016-08-19 ENCOUNTER — Telehealth: Payer: Self-pay | Admitting: Neurology

## 2016-08-19 NOTE — Telephone Encounter (Signed)
Patient called office in reference to mychart message he sent on 08/13/16 would like to have a call back please.

## 2016-08-19 NOTE — Telephone Encounter (Signed)
Called and spoke with the patient and just made him aware that a order was sent to [facility] in regards to the Mini travel CPAP machine. Pt verbalized understanding.

## 2016-08-26 DIAGNOSIS — G4733 Obstructive sleep apnea (adult) (pediatric): Secondary | ICD-10-CM | POA: Diagnosis not present

## 2016-09-26 DIAGNOSIS — G4733 Obstructive sleep apnea (adult) (pediatric): Secondary | ICD-10-CM | POA: Diagnosis not present

## 2016-09-29 DIAGNOSIS — D045 Carcinoma in situ of skin of trunk: Secondary | ICD-10-CM | POA: Diagnosis not present

## 2016-09-29 DIAGNOSIS — L720 Epidermal cyst: Secondary | ICD-10-CM | POA: Diagnosis not present

## 2016-09-29 DIAGNOSIS — D225 Melanocytic nevi of trunk: Secondary | ICD-10-CM | POA: Diagnosis not present

## 2016-09-29 DIAGNOSIS — D2261 Melanocytic nevi of right upper limb, including shoulder: Secondary | ICD-10-CM | POA: Diagnosis not present

## 2016-09-29 DIAGNOSIS — D485 Neoplasm of uncertain behavior of skin: Secondary | ICD-10-CM | POA: Diagnosis not present

## 2016-09-29 DIAGNOSIS — L570 Actinic keratosis: Secondary | ICD-10-CM | POA: Diagnosis not present

## 2016-09-29 DIAGNOSIS — I788 Other diseases of capillaries: Secondary | ICD-10-CM | POA: Diagnosis not present

## 2016-09-29 DIAGNOSIS — B353 Tinea pedis: Secondary | ICD-10-CM | POA: Diagnosis not present

## 2016-09-29 DIAGNOSIS — D2262 Melanocytic nevi of left upper limb, including shoulder: Secondary | ICD-10-CM | POA: Diagnosis not present

## 2016-09-29 DIAGNOSIS — D692 Other nonthrombocytopenic purpura: Secondary | ICD-10-CM | POA: Diagnosis not present

## 2016-10-09 DIAGNOSIS — Z23 Encounter for immunization: Secondary | ICD-10-CM | POA: Diagnosis not present

## 2016-12-06 DIAGNOSIS — H52203 Unspecified astigmatism, bilateral: Secondary | ICD-10-CM | POA: Diagnosis not present

## 2016-12-06 DIAGNOSIS — H401122 Primary open-angle glaucoma, left eye, moderate stage: Secondary | ICD-10-CM | POA: Diagnosis not present

## 2017-05-16 ENCOUNTER — Encounter: Payer: Self-pay | Admitting: Neurology

## 2017-05-19 ENCOUNTER — Ambulatory Visit (INDEPENDENT_AMBULATORY_CARE_PROVIDER_SITE_OTHER): Payer: PPO | Admitting: Neurology

## 2017-05-19 ENCOUNTER — Encounter: Payer: Self-pay | Admitting: Neurology

## 2017-05-19 ENCOUNTER — Other Ambulatory Visit: Payer: Self-pay | Admitting: Neurology

## 2017-05-19 VITALS — BP 140/72 | HR 68 | Ht 73.0 in | Wt 237.0 lb

## 2017-05-19 DIAGNOSIS — Z9989 Dependence on other enabling machines and devices: Secondary | ICD-10-CM | POA: Diagnosis not present

## 2017-05-19 DIAGNOSIS — G4733 Obstructive sleep apnea (adult) (pediatric): Secondary | ICD-10-CM | POA: Diagnosis not present
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
## 2017-05-20 DIAGNOSIS — G4733 Obstructive sleep apnea (adult) (pediatric): Secondary | ICD-10-CM | POA: Diagnosis not present

## 2017-06-15 DIAGNOSIS — H401122 Primary open-angle glaucoma, left eye, moderate stage: Secondary | ICD-10-CM | POA: Diagnosis not present

## 2017-08-10 DIAGNOSIS — Z125 Encounter for screening for malignant neoplasm of prostate: Secondary | ICD-10-CM | POA: Diagnosis not present

## 2017-08-10 DIAGNOSIS — I10 Essential (primary) hypertension: Secondary | ICD-10-CM | POA: Diagnosis not present

## 2017-08-10 DIAGNOSIS — R7301 Impaired fasting glucose: Secondary | ICD-10-CM | POA: Diagnosis not present

## 2017-08-10 DIAGNOSIS — R82998 Other abnormal findings in urine: Secondary | ICD-10-CM | POA: Diagnosis not present

## 2017-08-12 DIAGNOSIS — H4089 Other specified glaucoma: Secondary | ICD-10-CM | POA: Diagnosis not present

## 2017-08-12 DIAGNOSIS — N528 Other male erectile dysfunction: Secondary | ICD-10-CM | POA: Diagnosis not present

## 2017-08-12 DIAGNOSIS — E668 Other obesity: Secondary | ICD-10-CM | POA: Diagnosis not present

## 2017-08-12 DIAGNOSIS — K2970 Gastritis, unspecified, without bleeding: Secondary | ICD-10-CM | POA: Diagnosis not present

## 2017-08-12 DIAGNOSIS — H9193 Unspecified hearing loss, bilateral: Secondary | ICD-10-CM | POA: Diagnosis not present

## 2017-08-12 DIAGNOSIS — R7301 Impaired fasting glucose: Secondary | ICD-10-CM | POA: Diagnosis not present

## 2017-08-12 DIAGNOSIS — Z6833 Body mass index (BMI) 33.0-33.9, adult: Secondary | ICD-10-CM | POA: Diagnosis not present

## 2017-08-12 DIAGNOSIS — I69959 Hemiplegia and hemiparesis following unspecified cerebrovascular disease affecting unspecified side: Secondary | ICD-10-CM | POA: Diagnosis not present

## 2017-08-12 DIAGNOSIS — I6389 Other cerebral infarction: Secondary | ICD-10-CM | POA: Diagnosis not present

## 2017-08-12 DIAGNOSIS — Z0000 Encounter for general adult medical examination without abnormal findings: Secondary | ICD-10-CM | POA: Diagnosis not present

## 2017-08-12 DIAGNOSIS — Z1389 Encounter for screening for other disorder: Secondary | ICD-10-CM | POA: Diagnosis not present

## 2017-08-12 DIAGNOSIS — G4733 Obstructive sleep apnea (adult) (pediatric): Secondary | ICD-10-CM | POA: Diagnosis not present

## 2017-08-17 DIAGNOSIS — Z1212 Encounter for screening for malignant neoplasm of rectum: Secondary | ICD-10-CM | POA: Diagnosis not present

## 2017-08-18 DIAGNOSIS — G4733 Obstructive sleep apnea (adult) (pediatric): Secondary | ICD-10-CM | POA: Diagnosis not present

## 2017-09-29 DIAGNOSIS — L723 Sebaceous cyst: Secondary | ICD-10-CM | POA: Diagnosis not present

## 2017-09-29 DIAGNOSIS — L570 Actinic keratosis: Secondary | ICD-10-CM | POA: Diagnosis not present

## 2017-09-29 DIAGNOSIS — D485 Neoplasm of uncertain behavior of skin: Secondary | ICD-10-CM | POA: Diagnosis not present

## 2017-09-29 DIAGNOSIS — L821 Other seborrheic keratosis: Secondary | ICD-10-CM | POA: Diagnosis not present

## 2017-09-29 DIAGNOSIS — L720 Epidermal cyst: Secondary | ICD-10-CM | POA: Diagnosis not present

## 2017-09-29 DIAGNOSIS — D1801 Hemangioma of skin and subcutaneous tissue: Secondary | ICD-10-CM | POA: Diagnosis not present

## 2017-10-08 DIAGNOSIS — Z23 Encounter for immunization: Secondary | ICD-10-CM | POA: Diagnosis not present

## 2017-12-13 ENCOUNTER — Telehealth: Payer: Self-pay | Admitting: Neurology

## 2017-12-13 DIAGNOSIS — Z9989 Dependence on other enabling machines and devices: Principal | ICD-10-CM

## 2017-12-13 DIAGNOSIS — G4733 Obstructive sleep apnea (adult) (pediatric): Secondary | ICD-10-CM

## 2017-12-13 NOTE — Telephone Encounter (Signed)
Called the patient and just verified that he was wanting to change. Patient states that he already called and spoke with [facility] and would like to proceed transfer of care. I will write the order and send everything over to [facility] for  the patient. Pt verbalized understanding and was appreciative.

## 2017-12-13 NOTE — Telephone Encounter (Signed)
Patient states he wants to change his DME provider to [facility]. Please call and discuss.

## 2017-12-13 NOTE — Addendum Note (Signed)
Addended by: Darleen Crocker on: 12/13/2017 04:22 PM   Modules accepted: Orders

## 2017-12-23 DIAGNOSIS — G4733 Obstructive sleep apnea (adult) (pediatric): Secondary | ICD-10-CM | POA: Diagnosis not present

## 2018-01-25 DIAGNOSIS — Z961 Presence of intraocular lens: Secondary | ICD-10-CM | POA: Diagnosis not present

## 2018-01-25 DIAGNOSIS — H40022 Open angle with borderline findings, high risk, left eye: Secondary | ICD-10-CM | POA: Diagnosis not present

## 2018-04-03 ENCOUNTER — Encounter: Payer: Self-pay | Admitting: Gastroenterology

## 2018-05-16 ENCOUNTER — Telehealth: Payer: Self-pay | Admitting: Neurology

## 2018-05-16 NOTE — Telephone Encounter (Signed)
Due to current COVID 19 pandemic, our office is severely reducing in office visits until further notice, in order to minimize the risk to our patients and healthcare providers.   Called patient to offer virtual visit for his 5/12 appt. Patient accepted and verbalized understanding of doxy[employer]me process. Patient understands that he will receive an e-mail with the link and directions. Patient is aware that he will receive a call from RN as well as front office staff.  Pt understands that although there may be some limitations with this type of visit, we will take all precautions to reduce any security or privacy concerns.  Pt understands that this will be treated like an in office visit and we will file with pt's insurance, and there may be a patient responsible charge related to this service.

## 2018-05-22 ENCOUNTER — Encounter: Payer: Self-pay | Admitting: Neurology

## 2018-05-22 NOTE — Telephone Encounter (Signed)

## 2018-05-22 NOTE — Addendum Note (Signed)
Addended by: Darleen Crocker on: 05/22/2018 08:37 AM   Modules accepted: Orders

## 2018-05-23 ENCOUNTER — Ambulatory Visit (INDEPENDENT_AMBULATORY_CARE_PROVIDER_SITE_OTHER): Payer: PPO | Admitting: Neurology

## 2018-05-23 ENCOUNTER — Encounter: Payer: Self-pay | Admitting: Neurology

## 2018-05-23 ENCOUNTER — Other Ambulatory Visit: Payer: Self-pay

## 2018-05-23 DIAGNOSIS — J342 Deviated nasal septum: Secondary | ICD-10-CM | POA: Insufficient documentation

## 2018-05-23 DIAGNOSIS — Z9989 Dependence on other enabling machines and devices: Secondary | ICD-10-CM | POA: Diagnosis not present

## 2018-05-23 DIAGNOSIS — G4733 Obstructive sleep apnea (adult) (pediatric): Secondary | ICD-10-CM | POA: Diagnosis not present

## 2018-05-23 DIAGNOSIS — Z87820 Personal history of traumatic brain injury: Secondary | ICD-10-CM
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
## 2018-05-23 NOTE — Patient Instructions (Signed)
CPAP compliance is excellent, residual AI is low- continue using the CPAP as prescribed. Machine was issued in 2017.

## 2018-06-02 ENCOUNTER — Encounter: Payer: Self-pay | Admitting: Gastroenterology

## 2018-06-14 ENCOUNTER — Ambulatory Visit: Payer: PPO | Admitting: *Deleted

## 2018-06-14 ENCOUNTER — Other Ambulatory Visit: Payer: Self-pay

## 2018-06-14 VITALS — Ht 72.0 in | Wt 230.0 lb

## 2018-06-14 DIAGNOSIS — Z1211 Encounter for screening for malignant neoplasm of colon: Secondary | ICD-10-CM

## 2018-06-14 MED ORDER — PEG 3350-KCL-NA BICARB-NACL 420 G PO SOLR: 4000.0000 mL | Freq: Once | ORAL | 0 refills | Status: AC

## 2018-06-14 NOTE — Progress Notes (Signed)
Patient's pre-visit was done today over the phone with the patient due to Covid-19. Name,DOB and address verified. Insurance verified. Packet of Prep instructions mailed to patient including copy of a consent form and pre-procedure patient acknowledgement form-pt is aware. Patient understands to call us back with any questions or concerns.   Patient denies any allergies to eggs or soy. Patient denies any problems with anesthesia/sedation. Patient denies any oxygen use at home. Patient denies taking any diet/weight loss medications or blood thinners. EMMI education assisgned to patient on colonoscopy, this was explained and instructions given to patient.

## 2018-06-26 ENCOUNTER — Telehealth: Payer: Self-pay | Admitting: *Deleted

## 2018-06-26 NOTE — Telephone Encounter (Signed)
Covid-19 screening questions   Do you now or have you had a fever in the last 14 days?no  Do you have any respiratory symptoms of shortness of breath or cough now or in the last 14 days?no  Do you have any family members or close contacts with diagnosed or suspected Covid-19 in the past 14 days?no  Have you been tested for Covid-19 and found to be positive?no  Pt made aware of care partner policy and will bring a mask with him if he has one available. SM

## 2018-06-27 ENCOUNTER — Ambulatory Visit (AMBULATORY_SURGERY_CENTER): Payer: PPO | Admitting: Gastroenterology

## 2018-06-27 ENCOUNTER — Other Ambulatory Visit: Payer: Self-pay

## 2018-06-27 ENCOUNTER — Encounter: Payer: Self-pay | Admitting: Gastroenterology

## 2018-06-27 VITALS — BP 134/63 | HR 49 | Temp 99.0°F | Resp 11 | Ht 72.0 in | Wt 230.0 lb

## 2018-06-27 DIAGNOSIS — D123 Benign neoplasm of transverse colon: Secondary | ICD-10-CM | POA: Diagnosis not present

## 2018-06-27 DIAGNOSIS — D124 Benign neoplasm of descending colon: Secondary | ICD-10-CM

## 2018-06-27 DIAGNOSIS — Z1211 Encounter for screening for malignant neoplasm of colon: Secondary | ICD-10-CM | POA: Diagnosis not present

## 2018-06-27 DIAGNOSIS — D122 Benign neoplasm of ascending colon: Secondary | ICD-10-CM

## 2018-06-27 MED ORDER — SODIUM CHLORIDE 0.9 % IV SOLN: 500.0000 mL | Freq: Once | INTRAVENOUS | Status: DC

## 2018-06-27 NOTE — Patient Instructions (Signed)
Impression/Recommendations:  Polyp handout given to patient. Hemorrhoid handout given to patient. Diverticulosis handout given to patient.  Resume previous diet. Continue present medications.  Await pathology results.  Repeat colonoscopy recommended for surveillance.  Date to be determined after pathology results reviewed.  YOU HAD AN ENDOSCOPIC PROCEDURE TODAY AT [facility]:   Refer to the procedure report that was given to you for any specific questions about what was found during the examination.  If the procedure report does not answer your questions, please call your gastroenterologist to clarify.  If you requested that your care partner not be given the details of your procedure findings, then the procedure report has been included in a sealed envelope for you to review at your convenience later.  YOU SHOULD EXPECT: Some feelings of bloating in the abdomen. Passage of more gas than usual.  Walking can help get rid of the air that was put into your GI tract during the procedure and reduce the bloating. If you had a lower endoscopy (such as a colonoscopy or flexible sigmoidoscopy) you may notice spotting of blood in your stool or on the toilet paper. If you underwent a bowel prep for your procedure, you may not have a normal bowel movement for a few days.  Please Note:  You might notice some irritation and congestion in your nose or some drainage.  This is from the oxygen used during your procedure.  There is no need for concern and it should clear up in a day or so.  SYMPTOMS TO REPORT IMMEDIATELY:   Following lower endoscopy (colonoscopy or flexible sigmoidoscopy):  Excessive amounts of blood in the stool  Significant tenderness or worsening of abdominal pains  Swelling of the abdomen that is new, acute  Fever of 100F or higher For urgent or emergent issues, a gastroenterologist can be reached at any hour by calling [phone].   DIET:  We do recommend a  small meal at first, but then you may proceed to your regular diet.  Drink plenty of fluids but you should avoid alcoholic beverages for 24 hours.  ACTIVITY:  You should plan to take it easy for the rest of today and you should NOT DRIVE or use heavy machinery until tomorrow (because of the sedation medicines used during the test).    FOLLOW UP: Our staff will call the number listed on your records 48-72 hours following your procedure to check on you and address any questions or concerns that you may have regarding the information given to you following your procedure. If we do not reach you, we will leave a message.  We will attempt to reach you two times.  During this call, we will ask if you have developed any symptoms of COVID 19. If you develop any symptoms (ie: fever, flu-like symptoms, shortness of breath, cough etc.) before then, please call [phone].  If you test positive for Covid 19 in the 2 weeks post procedure, please call and report this information to [place].    If any biopsies were taken you will be contacted by phone or by letter within the next 1-3 weeks.  Please call us at [phone] if you have not heard about the biopsies in 3 weeks.    SIGNATURES/CONFIDENTIALITY: You and/or your care partner have signed paperwork which will be entered into your electronic medical record.  These signatures attest to the fact that that the information above on your After Visit Summary has been reviewed and is understood.  Full responsibility of the confidentiality of this discharge information lies with you and/or your care-partner.

## 2018-06-27 NOTE — Progress Notes (Signed)
Pt's states no medical or surgical changes since previsit or office visit. 

## 2018-06-27 NOTE — Progress Notes (Signed)
Riki Sheer took temp and Alden Hipp, RN did vitals.

## 2018-06-27 NOTE — Progress Notes (Signed)
Called to room to assist during endoscopic procedure.  Patient ID and intended procedure confirmed with present staff. Received instructions for my participation in the procedure from the performing physician.  

## 2018-06-27 NOTE — Progress Notes (Signed)
A/ox3, pleased with MAC, report to RN 

## 2018-06-27 NOTE — Op Note (Signed)
[facility] Patient Name: Marvin Zamora Procedure Date: 06/27/2018 1:50 PM MRN: [id number] Endoscopist: Aquilla. Loletha Carrow , MD Age: 73 Referring MD:  Date of Birth: 11/13/1945 Gender: Male Account #: [id number] Procedure:                Colonoscopy Indications:              Screening for colorectal malignant neoplasm (no                            polyps on last colonoscopy 05/2008) Medicines:                Monitored Anesthesia Care Procedure:                Pre-Anesthesia Assessment:                           - Prior to the procedure, a History and Physical                            was performed, and patient medications and                            allergies were reviewed. The patient's tolerance of                            previous anesthesia was also reviewed. The risks                            and benefits of the procedure and the sedation                            options and risks were discussed with the patient.                            All questions were answered, and informed consent                            was obtained. Prior Anticoagulants: The patient has                            taken no previous anticoagulant or antiplatelet                            agents except for aspirin. ASA Grade Assessment:                            III - A patient with severe systemic disease. After                            reviewing the risks and benefits, the patient was                            deemed in satisfactory condition to undergo the  procedure.                           After obtaining informed consent, the colonoscope                            was passed under direct vision. Throughout the                            procedure, the patient's blood pressure, pulse, and                            oxygen saturations were monitored continuously. The                            Colonoscope was introduced through the anus and                    advanced to the the cecum, identified by                            appendiceal orifice and ileocecal valve. The                            colonoscopy was somewhat difficult due to a                            redundant colon. The patient tolerated the                            procedure well. The quality of the bowel                            preparation was fair. The ileocecal valve,                            appendiceal orifice, and rectum were photographed. Scope In: 2:05:40 PM Scope Out: 6:16:07 PM Scope Withdrawal Time: 0 hours 25 minutes 20 seconds  Total Procedure Duration: 0 hours 32 minutes 38 seconds  Findings:                 One column of prolapsed internal hemorrhoid was                            found on perianal exam.                           Two sessile polyps were found in the ascending                            colon. The polyps were diminutive in size. These                            polyps were removed with a cold snare. Resection  and retrieval were complete.                           Two sessile polyps were found in the transverse                            colon. The polyps were 3 to 10 mm in size. These                            polyps were removed with a cold snare. Resection                            and retrieval were complete.                           Three sessile polyps were found in the descending                            colon. The polyps were 3 to 6 mm in size. These                            polyps were removed with a cold snare. Resection                            and retrieval were complete.                           Many diverticula were found in the entire colon.                           The exam was otherwise without abnormality on                            direct and retroflexion views. Complications:            No immediate complications. Estimated Blood Loss:     Estimated blood loss  was minimal. Impression:               - Preparation of the colon was fair.                           - Hemorrhoids found on perianal exam.                           - Two diminutive polyps in the ascending colon,                            removed with a cold snare. Resected and retrieved.                           - Two 3 to 10 mm polyps in the transverse colon,                            removed with a cold snare. Resected and  retrieved.                           - Three 3 to 6 mm polyps in the descending colon,                            removed with a cold snare. Resected and retrieved.                           - Diverticulosis in the entire examined colon.                           - The examination was otherwise normal on direct                            and retroflexion views. Recommendation:           - Patient has a contact number available for                            emergencies. The signs and symptoms of potential                            delayed complications were discussed with the                            patient. Return to normal activities tomorrow.                            Written discharge instructions were provided to the                            patient.                           - Resume previous diet.                           - Continue present medications.                           - Await pathology results.                           - Repeat colonoscopy is recommended for                            surveillance. The colonoscopy date will be                            determined after pathology results from today's                            exam become available for review. Henry L. Loletha Carrow, MD 06/27/2018 2:46:36 PM This report has been signed electronically.

## 2018-06-29 ENCOUNTER — Telehealth: Payer: Self-pay | Admitting: *Deleted

## 2018-06-29 NOTE — Telephone Encounter (Signed)
  Follow up Call-  Call back number 06/27/2018  Post procedure Call Back phone  # [phone]  Permission to leave phone message Yes  Some recent data might be hidden     Patient questions:  Do you have a fever, pain , or abdominal swelling? No. Pain Score  0 *  Have you tolerated food without any problems? Yes.    Have you been able to return to your normal activities? Yes.    Do you have any questions about your discharge instructions: Diet   No. Medications  No. Follow up visit  No.  Do you have questions or concerns about your Care? No.  Actions: * If pain score is 4 or above: No action needed, pain <4.  1. Have you developed a fever since your procedure? no  2.   Have you had an respiratory symptoms (SOB or cough) since your procedure? *no  3.   Have you tested positive for COVID 19 since your procedure no  4.   Have you had any family members/close contacts diagnosed with the COVID 19 since your procedure?  no   If yes to any of these questions please route to Joylene John, RN and Alphonsa Gin, Therapist, sports.

## 2018-06-30 ENCOUNTER — Encounter: Payer: Self-pay | Admitting: Gastroenterology

## 2018-08-30 DIAGNOSIS — G4733 Obstructive sleep apnea (adult) (pediatric): Secondary | ICD-10-CM | POA: Diagnosis not present

## 2018-09-04 DIAGNOSIS — R7301 Impaired fasting glucose: Secondary | ICD-10-CM | POA: Diagnosis not present

## 2018-09-04 DIAGNOSIS — Z125 Encounter for screening for malignant neoplasm of prostate: Secondary | ICD-10-CM | POA: Diagnosis not present

## 2018-09-04 DIAGNOSIS — Z23 Encounter for immunization: Secondary | ICD-10-CM | POA: Diagnosis not present

## 2018-09-04 DIAGNOSIS — I10 Essential (primary) hypertension: Secondary | ICD-10-CM | POA: Diagnosis not present

## 2018-09-07 DIAGNOSIS — R82998 Other abnormal findings in urine: Secondary | ICD-10-CM | POA: Diagnosis not present

## 2018-09-07 DIAGNOSIS — I10 Essential (primary) hypertension: Secondary | ICD-10-CM | POA: Diagnosis not present

## 2018-09-11 DIAGNOSIS — I10 Essential (primary) hypertension: Secondary | ICD-10-CM | POA: Diagnosis not present

## 2018-09-11 DIAGNOSIS — G4733 Obstructive sleep apnea (adult) (pediatric): Secondary | ICD-10-CM | POA: Diagnosis not present

## 2018-09-11 DIAGNOSIS — Z0000 Encounter for general adult medical examination without abnormal findings: Secondary | ICD-10-CM | POA: Diagnosis not present

## 2018-09-11 DIAGNOSIS — I69959 Hemiplegia and hemiparesis following unspecified cerebrovascular disease affecting unspecified side: Secondary | ICD-10-CM | POA: Diagnosis not present

## 2018-09-11 DIAGNOSIS — H547 Unspecified visual loss: Secondary | ICD-10-CM | POA: Diagnosis not present

## 2018-09-11 DIAGNOSIS — H409 Unspecified glaucoma: Secondary | ICD-10-CM | POA: Diagnosis not present

## 2018-09-11 DIAGNOSIS — J309 Allergic rhinitis, unspecified: Secondary | ICD-10-CM | POA: Diagnosis not present

## 2018-09-11 DIAGNOSIS — R7301 Impaired fasting glucose: Secondary | ICD-10-CM | POA: Diagnosis not present

## 2018-09-11 DIAGNOSIS — I639 Cerebral infarction, unspecified: Secondary | ICD-10-CM | POA: Diagnosis not present

## 2018-09-11 DIAGNOSIS — Z1331 Encounter for screening for depression: Secondary | ICD-10-CM | POA: Diagnosis not present

## 2018-09-11 DIAGNOSIS — E669 Obesity, unspecified: Secondary | ICD-10-CM | POA: Diagnosis not present

## 2018-10-02 DIAGNOSIS — Z1212 Encounter for screening for malignant neoplasm of rectum: Secondary | ICD-10-CM | POA: Diagnosis not present

## 2018-10-03 DIAGNOSIS — L821 Other seborrheic keratosis: Secondary | ICD-10-CM | POA: Diagnosis not present

## 2018-10-03 DIAGNOSIS — D2371 Other benign neoplasm of skin of right lower limb, including hip: Secondary | ICD-10-CM | POA: Diagnosis not present

## 2018-10-03 DIAGNOSIS — D1801 Hemangioma of skin and subcutaneous tissue: Secondary | ICD-10-CM | POA: Diagnosis not present

## 2018-10-03 DIAGNOSIS — L570 Actinic keratosis: Secondary | ICD-10-CM | POA: Diagnosis not present

## 2018-10-03 DIAGNOSIS — D225 Melanocytic nevi of trunk: Secondary | ICD-10-CM | POA: Diagnosis not present

## 2018-10-03 DIAGNOSIS — L905 Scar conditions and fibrosis of skin: Secondary | ICD-10-CM | POA: Diagnosis not present

## 2018-10-03 DIAGNOSIS — D2372 Other benign neoplasm of skin of left lower limb, including hip: Secondary | ICD-10-CM | POA: Diagnosis not present

## 2018-10-03 DIAGNOSIS — D1721 Benign lipomatous neoplasm of skin and subcutaneous tissue of right arm: Secondary | ICD-10-CM | POA: Diagnosis not present

## 2018-12-29 DIAGNOSIS — G4733 Obstructive sleep apnea (adult) (pediatric): Secondary | ICD-10-CM | POA: Diagnosis not present

## 2019-01-22 ENCOUNTER — Ambulatory Visit: Payer: Medicare Other | Attending: Internal Medicine

## 2019-01-22 DIAGNOSIS — Z23 Encounter for immunization: Secondary | ICD-10-CM | POA: Insufficient documentation

## 2019-01-22 NOTE — Progress Notes (Signed)
   [id number]  Name:  Marvin Zamora.    MRN: [id number] DOB: 26-Nov-1945  01/22/2019  [name] was observed post Covid-19 immunization for 30 minutes based on pre-vaccination screening without incidence. He was provided with Vaccine Information Sheet and instruction to access the V-Safe system.   [name] was instructed to call 911 with any severe reactions post vaccine: Marland Kitchen Difficulty breathing  . Swelling of your face and throat  . A fast heartbeat  . A bad rash all over your body  . Dizziness and weakness    Immunizations Administered    Name Date Dose VIS Date Route   Pfizer COVID-19 Vaccine 01/22/2019  8:42 AM 0.3 mL 12/22/2018 Intramuscular   Manufacturer: [place]   Lot: [id number]   [place]: [id number]

## 2019-01-24 DIAGNOSIS — H52203 Unspecified astigmatism, bilateral: Secondary | ICD-10-CM | POA: Diagnosis not present

## 2019-01-24 DIAGNOSIS — H40022 Open angle with borderline findings, high risk, left eye: Secondary | ICD-10-CM | POA: Diagnosis not present

## 2019-02-11 ENCOUNTER — Ambulatory Visit: Payer: PPO | Attending: Internal Medicine

## 2019-02-11 DIAGNOSIS — Z23 Encounter for immunization: Secondary | ICD-10-CM

## 2019-02-11 NOTE — Progress Notes (Signed)
   [id number]  Name:  Marvin Zamora.    MRN: [id number] DOB: 08/16/1945  02/11/2019  Mr. Marvin Zamora was observed post Covid-19 immunization for 15 minutes without incidence. He was provided with Vaccine Information Sheet and instruction to access the V-Safe system.   Mr. Marvin Zamora was instructed to call 911 with any severe reactions post vaccine: Marland Kitchen Difficulty breathing  . Swelling of your face and throat  . A fast heartbeat  . A bad rash all over your body  . Dizziness and weakness    Immunizations Administered    Name Date Dose VIS Date Route   Pfizer COVID-19 Vaccine 02/11/2019 10:13 AM 0.3 mL 12/22/2018 Intramuscular   Manufacturer: [place]   Lot: [id number]   [place]: [id number]

## 2019-05-23 ENCOUNTER — Ambulatory Visit: Payer: Self-pay | Admitting: Adult Health

## 2019-05-31 ENCOUNTER — Encounter: Payer: Self-pay | Admitting: Neurology

## 2019-05-31 ENCOUNTER — Other Ambulatory Visit: Payer: Self-pay

## 2019-05-31 ENCOUNTER — Ambulatory Visit: Payer: PPO | Admitting: Neurology

## 2019-05-31 VITALS — BP 142/78 | HR 98 | Ht 72.0 in | Wt 229.0 lb

## 2019-05-31 DIAGNOSIS — J342 Deviated nasal septum: Secondary | ICD-10-CM | POA: Diagnosis not present

## 2019-05-31 DIAGNOSIS — Z87820 Personal history of traumatic brain injury: Secondary | ICD-10-CM

## 2019-05-31 DIAGNOSIS — Z9989 Dependence on other enabling machines and devices: Secondary | ICD-10-CM | POA: Diagnosis not present

## 2019-05-31 DIAGNOSIS — G4733 Obstructive sleep apnea (adult) (pediatric): Secondary | ICD-10-CM | POA: Diagnosis not present

## 2019-05-31 NOTE — Patient Instructions (Signed)
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
## 2019-07-03 DIAGNOSIS — G4733 Obstructive sleep apnea (adult) (pediatric): Secondary | ICD-10-CM | POA: Diagnosis not present

## 2019-09-07 DIAGNOSIS — Z125 Encounter for screening for malignant neoplasm of prostate: Secondary | ICD-10-CM | POA: Diagnosis not present

## 2019-09-07 DIAGNOSIS — R7301 Impaired fasting glucose: Secondary | ICD-10-CM | POA: Diagnosis not present

## 2019-09-07 DIAGNOSIS — I10 Essential (primary) hypertension: Secondary | ICD-10-CM | POA: Diagnosis not present

## 2019-09-14 DIAGNOSIS — R82998 Other abnormal findings in urine: Secondary | ICD-10-CM | POA: Diagnosis not present

## 2019-09-14 DIAGNOSIS — Z0000 Encounter for general adult medical examination without abnormal findings: Secondary | ICD-10-CM | POA: Diagnosis not present

## 2019-09-14 DIAGNOSIS — Z1331 Encounter for screening for depression: Secondary | ICD-10-CM | POA: Diagnosis not present

## 2019-09-14 DIAGNOSIS — N529 Male erectile dysfunction, unspecified: Secondary | ICD-10-CM | POA: Diagnosis not present

## 2019-09-14 DIAGNOSIS — I10 Essential (primary) hypertension: Secondary | ICD-10-CM | POA: Diagnosis not present

## 2019-09-14 DIAGNOSIS — H409 Unspecified glaucoma: Secondary | ICD-10-CM | POA: Diagnosis not present

## 2019-09-14 DIAGNOSIS — I639 Cerebral infarction, unspecified: Secondary | ICD-10-CM | POA: Diagnosis not present

## 2019-09-14 DIAGNOSIS — G4733 Obstructive sleep apnea (adult) (pediatric): Secondary | ICD-10-CM | POA: Diagnosis not present

## 2019-09-14 DIAGNOSIS — I69959 Hemiplegia and hemiparesis following unspecified cerebrovascular disease affecting unspecified side: Secondary | ICD-10-CM | POA: Diagnosis not present

## 2019-09-14 DIAGNOSIS — R7301 Impaired fasting glucose: Secondary | ICD-10-CM | POA: Diagnosis not present

## 2019-09-14 DIAGNOSIS — H9190 Unspecified hearing loss, unspecified ear: Secondary | ICD-10-CM | POA: Diagnosis not present

## 2019-09-14 DIAGNOSIS — E669 Obesity, unspecified: Secondary | ICD-10-CM | POA: Diagnosis not present

## 2019-10-03 DIAGNOSIS — L738 Other specified follicular disorders: Secondary | ICD-10-CM | POA: Diagnosis not present

## 2019-10-03 DIAGNOSIS — L570 Actinic keratosis: Secondary | ICD-10-CM | POA: Diagnosis not present

## 2019-10-03 DIAGNOSIS — L309 Dermatitis, unspecified: Secondary | ICD-10-CM | POA: Diagnosis not present

## 2019-10-03 DIAGNOSIS — I8000 Phlebitis and thrombophlebitis of superficial vessels of unspecified lower extremity: Secondary | ICD-10-CM | POA: Diagnosis not present

## 2019-10-03 DIAGNOSIS — Z85828 Personal history of other malignant neoplasm of skin: Secondary | ICD-10-CM | POA: Diagnosis not present

## 2019-10-03 DIAGNOSIS — L821 Other seborrheic keratosis: Secondary | ICD-10-CM | POA: Diagnosis not present

## 2019-10-03 DIAGNOSIS — L723 Sebaceous cyst: Secondary | ICD-10-CM | POA: Diagnosis not present

## 2019-10-05 DIAGNOSIS — Z1212 Encounter for screening for malignant neoplasm of rectum: Secondary | ICD-10-CM | POA: Diagnosis not present

## 2019-11-06 DIAGNOSIS — J310 Chronic rhinitis: Secondary | ICD-10-CM | POA: Diagnosis not present

## 2019-11-06 DIAGNOSIS — K118 Other diseases of salivary glands: Secondary | ICD-10-CM | POA: Diagnosis not present

## 2019-11-08 ENCOUNTER — Other Ambulatory Visit: Payer: Self-pay | Admitting: Otolaryngology

## 2019-11-08 DIAGNOSIS — K118 Other diseases of salivary glands: Secondary | ICD-10-CM

## 2019-11-23 ENCOUNTER — Ambulatory Visit
Admission: RE | Admit: 2019-11-23 | Discharge: 2019-11-23 | Disposition: A | Discharge status: Home / Self Care | Payer: PPO | Source: Ambulatory Visit | Attending: Otolaryngology | Admitting: Otolaryngology

## 2019-11-23 ENCOUNTER — Other Ambulatory Visit: Payer: Self-pay

## 2019-11-23 DIAGNOSIS — K118 Other diseases of salivary glands: Secondary | ICD-10-CM

## 2019-11-23 DIAGNOSIS — D170 Benign lipomatous neoplasm of skin and subcutaneous tissue of head, face and neck: Secondary | ICD-10-CM | POA: Diagnosis not present

## 2019-11-23 MED ORDER — IOPAMIDOL (ISOVUE-300) INJECTION 61%
75.0000 mL | Freq: Once | INTRAVENOUS | Status: AC | PRN
  Administered 2019-11-23: 75 mL via INTRAVENOUS

## 2020-01-10 ENCOUNTER — Encounter: Payer: Self-pay | Admitting: Neurology

## 2020-01-14 ENCOUNTER — Other Ambulatory Visit: Payer: Self-pay | Admitting: Neurology

## 2020-01-14 DIAGNOSIS — G4733 Obstructive sleep apnea (adult) (pediatric): Secondary | ICD-10-CM

## 2020-02-13 DIAGNOSIS — H40013 Open angle with borderline findings, low risk, bilateral: Secondary | ICD-10-CM | POA: Diagnosis not present

## 2020-02-13 DIAGNOSIS — H52203 Unspecified astigmatism, bilateral: Secondary | ICD-10-CM | POA: Diagnosis not present

## 2020-02-13 DIAGNOSIS — Z961 Presence of intraocular lens: Secondary | ICD-10-CM | POA: Diagnosis not present

## 2020-02-19 DIAGNOSIS — G4733 Obstructive sleep apnea (adult) (pediatric): Secondary | ICD-10-CM | POA: Diagnosis not present

## 2020-05-27 DIAGNOSIS — K118 Other diseases of salivary glands: Secondary | ICD-10-CM | POA: Diagnosis not present

## 2020-05-28 ENCOUNTER — Encounter: Payer: Self-pay | Admitting: Neurology

## 2020-06-02 ENCOUNTER — Ambulatory Visit: Payer: PPO | Admitting: Neurology

## 2020-06-02 ENCOUNTER — Encounter: Payer: Self-pay | Admitting: Neurology

## 2020-06-02 VITALS — BP 155/86 | HR 67 | Ht 72.0 in | Wt 235.0 lb

## 2020-06-02 DIAGNOSIS — Z87820 Personal history of traumatic brain injury: Secondary | ICD-10-CM | POA: Diagnosis not present

## 2020-06-02 DIAGNOSIS — G4733 Obstructive sleep apnea (adult) (pediatric): Secondary | ICD-10-CM

## 2020-06-02 DIAGNOSIS — Z9989 Dependence on other enabling machines and devices: Secondary | ICD-10-CM

## 2020-06-02 DIAGNOSIS — J342 Deviated nasal septum: Secondary | ICD-10-CM | POA: Diagnosis not present

## 2020-06-02 NOTE — Patient Instructions (Signed)
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
## 2020-06-30 ENCOUNTER — Ambulatory Visit (INDEPENDENT_AMBULATORY_CARE_PROVIDER_SITE_OTHER): Payer: PPO | Admitting: Neurology

## 2020-06-30 DIAGNOSIS — J342 Deviated nasal septum: Secondary | ICD-10-CM

## 2020-06-30 DIAGNOSIS — G4733 Obstructive sleep apnea (adult) (pediatric): Secondary | ICD-10-CM | POA: Diagnosis not present

## 2020-06-30 DIAGNOSIS — Z87820 Personal history of traumatic brain injury: Secondary | ICD-10-CM
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
## 2020-07-08 NOTE — Progress Notes (Signed)
CPAP user since 07-2015, and is doing well. CPAP with an air sense 10 AutoSet with a serial #[id number] provided by [facility] and is due to be replaced. IMPRESSION: This HST confirmed the ongoing presence of severe OSA (obstructive sleep apnea) , at an AHI of 64/h. There were minor oxygen desaturation periods noted, none prolonged. NREM AHI was higher than REM AHI.  RECOMMENDATION: The patient will need to continue CPAP use and I will order the same EPR settings for his new machine. Auto setting from 6-14 cm water, 3 cm EPR and mask of his choice. Heated humidification will be provided. PS: If the auto-settings are not comfortable, change the machine back to a set pressure of 9 cm water.    INTERPRETING PHYSICIAN: Larey Seat, MD
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
## 2020-07-08 NOTE — Addendum Note (Signed)
Addended by: Larey Seat on: 07/08/2020 03:39 PM   Modules accepted: Orders

## 2020-07-09 ENCOUNTER — Telehealth: Payer: Self-pay | Admitting: Neurology

## 2020-07-09 NOTE — Telephone Encounter (Signed)
I called pt. I advised pt that Dr. Brett Fairy reviewed their sleep study results and found that pt still has severe sleep apnea present. Dr. Brett Fairy recommends that pt continues CPAP and will order a new auto CPAP. I reviewed PAP compliance expectations with the pt. Pt is agreeable to starting a CPAP. I advised pt that an order will be sent to a DME, [facility], and [facility] will call the pt within about one week after they file with the pt's insurance. [facility] will show the pt how to use the machine, fit for masks, and troubleshoot the CPAP if needed. A follow up appt was made for insurance purposes with Dr. Brett Fairy on 10/27/2020 at 9:30 am. Pt verbalized understanding to arrive 15 minutes early and bring their CPAP. A letter with all of this information in it will be mailed to the pt as a reminder. I verified with the pt that the address we have on file is correct. Pt verbalized understanding of results. Pt had no questions at this time but was encouraged to call back if questions arise. I have sent the order to [facility] and have received confirmation that they have received the order.

## 2020-07-09 NOTE — Telephone Encounter (Signed)
-----   Message from Larey Seat, MD sent at 07/08/2020  3:39 PM EDT ----- CPAP user since 07-2015, and is doing well. CPAP with an air sense 10 AutoSet with a serial #[id number] provided by [facility] and is due to be replaced. IMPRESSION: This HST confirmed the ongoing presence of severe OSA (obstructive sleep apnea) , at an AHI of 64/h. There were minor oxygen desaturation periods noted, none prolonged. NREM AHI was higher than REM AHI.  RECOMMENDATION: The patient will need to continue CPAP use and I will order the same EPR settings for his new machine. Auto setting from 6-14 cm water, 3 cm EPR and mask of his choice. Heated humidification will be provided. PS: If the auto-settings are not comfortable, change the machine back to a set pressure of 9 cm water.    INTERPRETING PHYSICIAN: Larey Seat, MD

## 2020-07-18 NOTE — Progress Notes (Signed)
Surgical Instructions   Your procedure is scheduled on 07/23/20. Report to [facility] Main Entrance "A" at 09:30 A.M., then check in with the Admitting office. Call this number if you have problems the morning of surgery: [phone]   If you have any questions prior to your surgery date call [phone]: Open Monday-Friday 8am-4pm   Remember:Do not eat after midnight the night before your surgery  You may drink clear liquids until 08:30 AM the morning of your surgery.   Clear liquids allowed are: Water, Non-Citrus Juices (without pulp), Carbonated Beverages, Clear Tea, Black Coffee Only, and Gatorade   Take these medicines the morning of surgery with A SIP OF WATER  amLODipine (NORVASC) atorvastatin (LIPITOR)  omeprazole (PRILOSEC)    Follow your surgeon's instructions on when to stop Aspirin.  If no instructions were given by your surgeon then you will need to call the office to get those instructions.      As of today, STOP taking any Aleve, Naproxen, Ibuprofen, Motrin, Advil, Goody's, BC's, all herbal medications, fish oil, and all vitamins.          Do not wear jewelry or makeup Do not wear lotions, powders, colognes, or deodorant. Men may shave face and neck. Do not bring valuables to the hospital.   [facility] is not responsible for any belongings or valuables.  Do NOT Smoke (Tobacco/Vaping) or drink Alcohol 24 hours prior to your procedure If you use a CPAP at night, you may bring all equipment for your overnight stay.   Contacts, glasses, dentures or bridgework may not be worn into surgery, please bring cases for these belongings   For patients admitted to the hospital, discharge time will be determined by your treatment team.   Patients discharged the day of surgery will not be allowed to drive home, and someone needs to stay with them for 24 hours.  ONLY 1 SUPPORT PERSON MAY BE PRESENT WHILE YOU ARE IN SURGERY. IF YOU ARE TO BE ADMITTED ONCE YOU ARE IN YOUR  ROOM YOU WILL BE ALLOWED TWO (2) VISITORS.  Minor children may have two parents present. Special consideration for safety and communication needs will be reviewed on a case by case basis.  Special instructions:    Oral Hygiene is also important to reduce your risk of infection.  Remember - BRUSH YOUR TEETH THE MORNING OF SURGERY WITH YOUR REGULAR TOOTHPASTE   [facility]- Preparing For Surgery  Before surgery, you can play an important role. Because skin is not sterile, your skin needs to be as free of germs as possible. You can reduce the number of germs on your skin by washing with CHG (chlorahexidine gluconate) Soap before surgery.  CHG is an antiseptic cleaner which kills germs and bonds with the skin to continue killing germs even after washing.     Please do not use if you have an allergy to CHG or antibacterial soaps. If your skin becomes reddened/irritated stop using the CHG.  Do not shave (including legs and underarms) for at least 48 hours prior to first CHG shower. It is OK to shave your face.  Please follow these instructions carefully.     Shower the NIGHT BEFORE SURGERY and the MORNING OF SURGERY with CHG Soap.   If you chose to wash your hair, wash your hair first as usual with your normal shampoo. After you shampoo, rinse your hair and body thoroughly to remove the shampoo.  Then [employer] and genitals (private parts) with your normal  soap and rinse thoroughly to remove soap.  After that Use CHG Soap as you would any other liquid soap. You can apply CHG directly to the skin and wash gently with a scrungie or a clean washcloth.   Apply the CHG Soap to your body ONLY FROM THE NECK DOWN.  Do not use on open wounds or open sores. Avoid contact with your eyes, ears, mouth and genitals (private parts). Wash Face and genitals (private parts)  with your normal soap.   Wash thoroughly, paying special attention to the area where your surgery will be performed.  Thoroughly rinse your  body with warm water from the neck down.  DO NOT shower/wash with your normal soap after using and rinsing off the CHG Soap.  Pat yourself dry with a CLEAN TOWEL.  Wear CLEAN PAJAMAS to bed the night before surgery  Place CLEAN SHEETS on your bed the night before your surgery  DO NOT SLEEP WITH PETS.   Day of Surgery:  Take a shower with CHG soap. Wear Clean/Comfortable clothing the morning of surgery Do not apply any deodorants/lotions.   Remember to brush your teeth WITH YOUR REGULAR TOOTHPASTE.   Please read over the following fact sheets that you were given.

## 2020-07-21 ENCOUNTER — Other Ambulatory Visit: Payer: Self-pay

## 2020-07-21 ENCOUNTER — Encounter (HOSPITAL_COMMUNITY): Payer: Self-pay

## 2020-07-21 ENCOUNTER — Encounter (HOSPITAL_COMMUNITY)
Admission: RE | Admit: 2020-07-21 | Discharge: 2020-07-21 | Disposition: A | Discharge status: Home / Self Care | Payer: PPO | Source: Ambulatory Visit | Attending: Otolaryngology | Admitting: Otolaryngology

## 2020-07-21 DIAGNOSIS — Z20822 Contact with and (suspected) exposure to covid-19: Secondary | ICD-10-CM | POA: Diagnosis not present

## 2020-07-21 DIAGNOSIS — Z01818 Encounter for other preprocedural examination: Secondary | ICD-10-CM | POA: Diagnosis not present

## 2020-07-21 LAB — BASIC METABOLIC PANEL
Anion gap: 6 (ref 5–15)
BUN: 17 mg/dL (ref 8–23)
CO2: 27 mmol/L (ref 22–32)
Calcium: 9.2 mg/dL (ref 8.9–10.3)
Chloride: 106 mmol/L (ref 98–111)
Creatinine, Ser: 1.04 mg/dL (ref 0.61–1.24)
GFR, Estimated: >60 mL/min (ref >60)
Glucose, Bld: 115 mg/dL — ABNORMAL HIGH (ref 70–99)
Potassium: 4.2 mmol/L (ref 3.5–5.1)
Sodium: 139 mmol/L (ref 135–145)

## 2020-07-21 LAB — CBC
HCT: 41.9 % (ref 39.0–52.0)
Hemoglobin: 13.8 g/dL (ref 13.0–17.0)
MCH: 31.4 pg (ref 26.0–34.0)
MCHC: 32.9 g/dL (ref 30.0–36.0)
MCV: 95.2 fL (ref 80.0–100.0)
Platelets: 187 10*3/uL (ref 150–400)
RBC: 4.4 MIL/uL (ref 4.22–5.81)
RDW: 13.2 % (ref 11.5–15.5)
WBC: 4.6 10*3/uL (ref 4.0–10.5)
nRBC: 0 % (ref 0.0–0.2)

## 2020-07-21 NOTE — Progress Notes (Signed)
PCP - Crist Infante MD Cardiologist - saw Kirk Ruths MD in 2013 after TIA  PPM/ICD - denies Device Orders -  Rep Notified -   Chest x-ray - no EKG - 07/21/20 Stress Test - no ECHO - 10/31/12 Cardiac Cath - no  Sleep Study - 06/30/20 CPAP - yes  Fasting Blood Sugar - n/a Checks Blood Sugar _____ times a day  Blood Thinner Instructions:n/a Aspirin Instructions:last dose 07/21/20  ERAS Protcol -n/a PRE-SURGERY Ensure or G2-   COVID TEST- 07/21/20   Anesthesia review: no  Patient denies shortness of breath, fever, cough and chest pain at PAT appointment   All instructions explained to the patient, with a verbal understanding of the material. Patient agrees to go over the instructions while at home for a better understanding. Patient also instructed to self quarantine after being tested for COVID-19. The opportunity to ask questions was provided.

## 2020-07-21 NOTE — Progress Notes (Signed)
Surgical Instructions   Your procedure is scheduled on 07/23/20. Report to [facility] Main Entrance "A" at 09:30 A.M., then check in with the Admitting office. Call this number if you have problems the morning of surgery: [phone]   If you have any questions prior to your surgery date call [phone]: Open Monday-Friday 8am-4pm   Remember:Do not eat or drink after midnight the night before your surgery     Take these medicines the morning of surgery with A SIP OF WATER  amLODipine (NORVASC) atorvastatin (LIPITOR)  omeprazole (PRILOSEC)    Follow your surgeon's instructions on when to stop Aspirin.  If no instructions were given by your surgeon then you will need to call the office to get those instructions.      As of today, STOP taking any Aleve, Naproxen, Ibuprofen, Motrin, Advil, Goody's, BC's, all herbal medications, fish oil, and all vitamins.          Do not wear jewelry or makeup Do not wear lotions, powders, colognes, or deodorant. Men may shave face and neck. Do not bring valuables to the hospital.   [facility] is not responsible for any belongings or valuables.  Do NOT Smoke (Tobacco/Vaping) or drink Alcohol 24 hours prior to your procedure If you use a CPAP at night, you may bring all equipment for your overnight stay.   Contacts, glasses, dentures or bridgework may not be worn into surgery, please bring cases for these belongings   For patients admitted to the hospital, discharge time will be determined by your treatment team.   Patients discharged the day of surgery will not be allowed to drive home, and someone needs to stay with them for 24 hours.  ONLY 1 SUPPORT PERSON MAY BE PRESENT WHILE YOU ARE IN SURGERY. IF YOU ARE TO BE ADMITTED ONCE YOU ARE IN YOUR ROOM YOU WILL BE ALLOWED TWO (2) VISITORS.  Minor children may have two parents present. Special consideration for safety and communication needs will be reviewed on a case by case basis.  Special  instructions:    Oral Hygiene is also important to reduce your risk of infection.  Remember - BRUSH YOUR TEETH THE MORNING OF SURGERY WITH YOUR REGULAR TOOTHPASTE   [facility]- Preparing For Surgery  Before surgery, you can play an important role. Because skin is not sterile, your skin needs to be as free of germs as possible. You can reduce the number of germs on your skin by washing with CHG (chlorahexidine gluconate) Soap before surgery.  CHG is an antiseptic cleaner which kills germs and bonds with the skin to continue killing germs even after washing.     Please do not use if you have an allergy to CHG or antibacterial soaps. If your skin becomes reddened/irritated stop using the CHG.  Do not shave (including legs and underarms) for at least 48 hours prior to first CHG shower. It is OK to shave your face.  Please follow these instructions carefully.     Shower the NIGHT BEFORE SURGERY and the MORNING OF SURGERY with CHG Soap.   If you chose to wash your hair, wash your hair first as usual with your normal shampoo. After you shampoo, rinse your hair and body thoroughly to remove the shampoo.  Then [employer] and genitals (private parts) with your normal soap and rinse thoroughly to remove soap.  After that Use CHG Soap as you would any other liquid soap. You can apply CHG directly to the skin and wash  gently with a scrungie or a clean washcloth.   Apply the CHG Soap to your body ONLY FROM THE NECK DOWN.  Do not use on open wounds or open sores. Avoid contact with your eyes, ears, mouth and genitals (private parts). Wash Face and genitals (private parts)  with your normal soap.   Wash thoroughly, paying special attention to the area where your surgery will be performed.  Thoroughly rinse your body with warm water from the neck down.  DO NOT shower/wash with your normal soap after using and rinsing off the CHG Soap.  Pat yourself dry with a CLEAN TOWEL.  Wear CLEAN PAJAMAS to bed the  night before surgery  Place CLEAN SHEETS on your bed the night before your surgery  DO NOT SLEEP WITH PETS.   Day of Surgery:  Take a shower with CHG soap. Wear Clean/Comfortable clothing the morning of surgery Do not apply any deodorants/lotions.   Remember to brush your teeth WITH YOUR REGULAR TOOTHPASTE.   Please read over the following fact sheets that you were given.

## 2020-07-21 NOTE — Progress Notes (Signed)
Left voicemail with Dr. Victorio Palm assistant requesting preop orders for this patient who is scheduled for surgery on 7/13. Dr. Wilburn Cornelia was previously sent an [place].

## 2020-07-22 LAB — SARS CORONAVIRUS 2 (TAT 6-24 HRS): SARS Coronavirus 2: NEGATIVE

## 2020-07-23 ENCOUNTER — Encounter (HOSPITAL_COMMUNITY)
Admission: RE | Disposition: A | Discharge status: Home / Self Care | Payer: Self-pay | Source: Home / Self Care | Attending: Otolaryngology

## 2020-07-23 ENCOUNTER — Ambulatory Visit (HOSPITAL_COMMUNITY): Payer: PPO

## 2020-07-23 ENCOUNTER — Encounter (HOSPITAL_COMMUNITY): Payer: Self-pay | Admitting: Otolaryngology

## 2020-07-23 ENCOUNTER — Other Ambulatory Visit: Payer: Self-pay

## 2020-07-23 ENCOUNTER — Ambulatory Visit (HOSPITAL_COMMUNITY)
Admission: RE | Admit: 2020-07-23 | Discharge: 2020-07-23 | Disposition: A | Discharge status: Home / Self Care | Payer: PPO | Attending: Otolaryngology | Admitting: Otolaryngology

## 2020-07-23 DIAGNOSIS — Z87891 Personal history of nicotine dependence: Secondary | ICD-10-CM | POA: Diagnosis not present

## 2020-07-23 DIAGNOSIS — Z9989 Dependence on other enabling machines and devices: Secondary | ICD-10-CM | POA: Diagnosis not present

## 2020-07-23 DIAGNOSIS — Z79899 Other long term (current) drug therapy: Secondary | ICD-10-CM | POA: Insufficient documentation

## 2020-07-23 DIAGNOSIS — Z7982 Long term (current) use of aspirin: Secondary | ICD-10-CM | POA: Diagnosis not present

## 2020-07-23 DIAGNOSIS — G4733 Obstructive sleep apnea (adult) (pediatric): Secondary | ICD-10-CM | POA: Diagnosis not present

## 2020-07-23 DIAGNOSIS — Z8673 Personal history of transient ischemic attack (TIA), and cerebral infarction without residual deficits: Secondary | ICD-10-CM | POA: Diagnosis not present

## 2020-07-23 DIAGNOSIS — R220 Localized swelling, mass and lump, head: Secondary | ICD-10-CM | POA: Diagnosis not present

## 2020-07-23 DIAGNOSIS — I10 Essential (primary) hypertension: Secondary | ICD-10-CM | POA: Diagnosis not present

## 2020-07-23 DIAGNOSIS — K118 Other diseases of salivary glands: Secondary | ICD-10-CM | POA: Diagnosis not present

## 2020-07-23 HISTORY — PX: PAROTIDECTOMY: SHX2163

## 2020-07-23 SURGERY — EXCISION, PAROTID GLAND
Anesthesia: General | Site: Neck | Laterality: Right

## 2020-07-23 MED ORDER — SUCCINYLCHOLINE CHLORIDE 200 MG/10ML IV SOSY
PREFILLED_SYRINGE | INTRAVENOUS | Status: DC | PRN
  Administered 2020-07-23: 140 mg via INTRAVENOUS

## 2020-07-23 MED ORDER — FENTANYL CITRATE (PF) 100 MCG/2ML IJ SOLN: 25.0000 ug | INTRAMUSCULAR | Status: DC | PRN

## 2020-07-23 MED ORDER — PHENYLEPHRINE HCL-NACL 10-0.9 MG/250ML-% IV SOLN
INTRAVENOUS | Status: DC | PRN
  Administered 2020-07-23: 30 ug/min via INTRAVENOUS

## 2020-07-23 MED ORDER — CHLORHEXIDINE GLUCONATE 0.12 % MT SOLN
15.0000 mL | Freq: Once | OROMUCOSAL | Status: AC
  Administered 2020-07-23: 15 mL via OROMUCOSAL
  Filled 2020-07-23: qty 15

## 2020-07-23 MED ORDER — DEXAMETHASONE SODIUM PHOSPHATE 10 MG/ML IJ SOLN
INTRAMUSCULAR | Status: DC | PRN
  Administered 2020-07-23: 10 mg via INTRAVENOUS

## 2020-07-23 MED ORDER — ONDANSETRON HCL 4 MG/2ML IJ SOLN
INTRAMUSCULAR | Status: DC | PRN
  Administered 2020-07-23: 4 mg via INTRAVENOUS

## 2020-07-23 MED ORDER — ARTIFICIAL TEARS OPHTHALMIC OINT
TOPICAL_OINTMENT | OPHTHALMIC | Status: DC | PRN
  Administered 2020-07-23: 1 via OPHTHALMIC

## 2020-07-23 MED ORDER — 0.9 % SODIUM CHLORIDE (POUR BTL) OPTIME
TOPICAL | Status: DC | PRN
  Administered 2020-07-23: 1000 mL

## 2020-07-23 MED ORDER — EPHEDRINE SULFATE-NACL 50-0.9 MG/10ML-% IV SOSY
PREFILLED_SYRINGE | INTRAVENOUS | Status: DC | PRN
  Administered 2020-07-23: 10 mg via INTRAVENOUS
  Administered 2020-07-23: 5 mg via INTRAVENOUS

## 2020-07-23 MED ORDER — LIDOCAINE 2% (20 MG/ML) 5 ML SYRINGE
INTRAMUSCULAR | Status: DC | PRN
  Administered 2020-07-23: 100 mg via INTRAVENOUS

## 2020-07-23 MED ORDER — ONDANSETRON HCL 4 MG/2ML IJ SOLN: 4.0000 mg | Freq: Once | INTRAMUSCULAR | Status: DC | PRN

## 2020-07-23 MED ORDER — ACETAMINOPHEN 10 MG/ML IV SOLN: 1000.0000 mg | Freq: Once | INTRAVENOUS | Status: DC | PRN

## 2020-07-23 MED ORDER — LIDOCAINE-EPINEPHRINE 1 %-1:100000 IJ SOLN
INTRAMUSCULAR | Status: DC | PRN
  Administered 2020-07-23: 5 mL

## 2020-07-23 MED ORDER — PROPOFOL 10 MG/ML IV BOLUS
INTRAVENOUS | Status: AC
  Filled 2020-07-23: qty 20

## 2020-07-23 MED ORDER — PROPOFOL 10 MG/ML IV BOLUS
INTRAVENOUS | Status: DC | PRN
  Administered 2020-07-23: 200 mg via INTRAVENOUS

## 2020-07-23 MED ORDER — LACTATED RINGERS IV SOLN: INTRAVENOUS | Status: DC

## 2020-07-23 MED ORDER — FENTANYL CITRATE (PF) 250 MCG/5ML IJ SOLN
INTRAMUSCULAR | Status: DC | PRN
  Administered 2020-07-23: 100 ug via INTRAVENOUS
  Administered 2020-07-23: 50 ug via INTRAVENOUS

## 2020-07-23 MED ORDER — CEFAZOLIN SODIUM-DEXTROSE 2-4 GM/100ML-% IV SOLN
2.0000 g | Freq: Once | INTRAVENOUS | Status: AC
  Administered 2020-07-23: 2 g via INTRAVENOUS
  Filled 2020-07-23: qty 100

## 2020-07-23 MED ORDER — FENTANYL CITRATE (PF) 250 MCG/5ML IJ SOLN
INTRAMUSCULAR | Status: AC
  Filled 2020-07-23: qty 5

## 2020-07-23 MED ORDER — ORAL CARE MOUTH RINSE: 15.0000 mL | Freq: Once | OROMUCOSAL | Status: AC

## 2020-07-23 SURGICAL SUPPLY — 55 items
ADH SKN CLS APL DERMABOND .7 (GAUZE/BANDAGES/DRESSINGS) ×1
ATTRACTOMAT 16X20 MAGNETIC DRP (DRAPES) IMPLANT
BAG COUNTER SPONGE SURGICOUNT (BAG) ×2 IMPLANT
BAG SPNG CNTER NS LX DISP (BAG) ×1
BLADE CLIPPER SURG (BLADE) IMPLANT
BLADE SURG 15 STRL LF DISP TIS (BLADE) IMPLANT
BLADE SURG 15 STRL SS (BLADE)
CABLE BIPOLOR RESECTION CORD (MISCELLANEOUS) ×2 IMPLANT
CANISTER SUCT 3000ML PPV (MISCELLANEOUS) ×2 IMPLANT
CLEANER TIP ELECTROSURG 2X2 (MISCELLANEOUS) ×2 IMPLANT
CNTNR URN SCR LID CUP LEK RST (MISCELLANEOUS) ×1 IMPLANT
CONT SPEC 4OZ STRL OR WHT (MISCELLANEOUS) ×2
COVER SURGICAL LIGHT HANDLE (MISCELLANEOUS) ×2 IMPLANT
DERMABOND ADVANCED (GAUZE/BANDAGES/DRESSINGS) ×1
DERMABOND ADVANCED .7 DNX12 (GAUZE/BANDAGES/DRESSINGS) ×1 IMPLANT
DRAIN JACKSON RD 7FR 3/32 (WOUND CARE) IMPLANT
DRAIN PENROSE 1/4X12 LTX STRL (WOUND CARE) IMPLANT
DRAIN SNY 10 ROU (WOUND CARE) IMPLANT
DRAPE HALF SHEET 40X57 (DRAPES) IMPLANT
DRAPE SURG 17X23 STRL (DRAPES) ×2 IMPLANT
DRSG TEGADERM 2-3/8X2-3/4 SM (GAUZE/BANDAGES/DRESSINGS) ×10 IMPLANT
ELECT COATED BLADE 2.86 ST (ELECTRODE) ×2 IMPLANT
ELECT PAIRED SUBDERMAL (MISCELLANEOUS) ×2
ELECT REM PT RETURN 9FT ADLT (ELECTROSURGICAL) ×2
ELECTRODE PAIRED SUBDERMAL (MISCELLANEOUS) ×1 IMPLANT
ELECTRODE REM PT RTRN 9FT ADLT (ELECTROSURGICAL) ×1 IMPLANT
EVACUATOR SILICONE 100CC (DRAIN) IMPLANT
FORCEPS BIPOLAR SPETZLER 8 1.0 (NEUROSURGERY SUPPLIES) ×2 IMPLANT
GAUZE 4X4 16PLY ~~LOC~~+RFID DBL (SPONGE) ×3 IMPLANT
GLOVE SURG ENC TEXT LTX SZ7 (GLOVE) ×2 IMPLANT
GOWN STRL REUS W/ TWL LRG LVL3 (GOWN DISPOSABLE) ×2 IMPLANT
GOWN STRL REUS W/TWL LRG LVL3 (GOWN DISPOSABLE) ×4
KIT BASIN OR (CUSTOM PROCEDURE TRAY) ×2 IMPLANT
KIT TURNOVER KIT B (KITS) ×2 IMPLANT
NDL HYPO 25GX1X1/2 BEV (NEEDLE) ×1 IMPLANT
NEEDLE HYPO 25GX1X1/2 BEV (NEEDLE) ×2 IMPLANT
NS IRRIG 1000ML POUR BTL (IV SOLUTION) ×2 IMPLANT
PAD ARMBOARD 7.5X6 YLW CONV (MISCELLANEOUS) ×4 IMPLANT
PENCIL SMOKE EVACUATOR (MISCELLANEOUS) ×2 IMPLANT
PROBE NERVBE PRASS .33 (MISCELLANEOUS) ×2 IMPLANT
SHEARS HARMONIC 9CM CVD (BLADE) ×2 IMPLANT
SPONGE INTESTINAL PEANUT (DISPOSABLE) IMPLANT
STAPLER VISISTAT 35W (STAPLE) ×2 IMPLANT
SUT ETHILON 3 0 PS 1 (SUTURE) IMPLANT
SUT ETHILON 5 0 P 3 18 (SUTURE)
SUT ETHILON 6 0 P 1 (SUTURE) IMPLANT
SUT NYLON ETHILON 5-0 P-3 1X18 (SUTURE) IMPLANT
SUT SILK 2 0 PERMA HAND 18 BK (SUTURE) IMPLANT
SUT SILK 2 0 REEL (SUTURE) ×2 IMPLANT
SUT SILK 2 0 SH CR/8 (SUTURE) ×2 IMPLANT
SUT SILK 3 0 REEL (SUTURE) ×2 IMPLANT
SUT VIC AB 5-0 P-3 18XBRD (SUTURE) ×1 IMPLANT
SUT VIC AB 5-0 P3 18 (SUTURE) ×2
SUT VICRYL 4-0 PS2 18IN ABS (SUTURE) ×2 IMPLANT
TRAY ENT MC OR (CUSTOM PROCEDURE TRAY) ×2 IMPLANT

## 2020-07-23 NOTE — Transfer of Care (Signed)
Immediate Anesthesia Transfer of Care Note  Patient: Marvin Zamora.  Procedure(s) Performed: PAROTIDECTOMY (Right: Neck)  Patient Location: PACU  Anesthesia Type:General  Level of Consciousness: awake and alert   Airway & Oxygen Therapy: Patient Spontanous Breathing and Patient connected to face mask oxygen  Post-op Assessment: Report given to RN and Post -op Vital signs reviewed and stable  Post vital signs: Reviewed and stable  Last Vitals:  Vitals Value Taken Time  BP 164/78 07/23/20 1329  Temp    Pulse 75 07/23/20 1331  Resp 21 07/23/20 1331  SpO2 95 % 07/23/20 1331  Vitals shown include unvalidated device data.  Last Pain:  Vitals:   07/23/20 0952  TempSrc:   PainSc: 0-No pain         Complications: No notable events documented.

## 2020-07-23 NOTE — Anesthesia Preprocedure Evaluation (Signed)
Anesthesia Evaluation  Patient identified by MRN, date of birth, ID band Patient awake    Reviewed: Allergy & Precautions, NPO status , Patient's Chart, lab work & pertinent test results  Airway Mallampati: II  TM Distance: >3 FB Neck ROM: Full    Dental  (+) Teeth Intact   Pulmonary sleep apnea and Continuous Positive Airway Pressure Ventilation , former smoker,    Pulmonary exam normal        Cardiovascular hypertension, Pt. on medications  Rhythm:Regular Rate:Normal     Neuro/Psych CVA (6 years ago), No Residual Symptoms negative psych ROS   GI/Hepatic Neg liver ROS, GERD  Medicated,  Endo/Other  negative endocrine ROSParotid mass  Renal/GU negative Renal ROS  negative genitourinary   Musculoskeletal negative musculoskeletal ROS (+)   Abdominal (+)  Abdomen: soft. Bowel sounds: normal.  Peds  Hematology negative hematology ROS (+)   Anesthesia Other Findings   Reproductive/Obstetrics                            Anesthesia Physical Anesthesia Plan  ASA: 3  Anesthesia Plan: General   Post-op Pain Management:    Induction: Intravenous  PONV Risk Score and Plan: 2 and Ondansetron, Dexamethasone and Treatment may vary due to age or medical condition  Airway Management Planned: Mask and Oral ETT  Additional Equipment: None  Intra-op Plan:   Post-operative Plan: Extubation in OR  Informed Consent: I have reviewed the patients History and Physical, chart, labs and discussed the procedure including the risks, benefits and alternatives for the proposed anesthesia with the patient or authorized representative who has indicated his/her understanding and acceptance.     Dental advisory given  Plan Discussed with: CRNA  Anesthesia Plan Comments: (Lab Results      Component                Value               Date                      WBC                      4.6                  07/21/2020                HGB                      13.8                07/21/2020                HCT                      41.9                07/21/2020                MCV                      95.2                07/21/2020                PLT  187                 07/21/2020           Lab Results      Component                Value               Date                      NA                       139                 07/21/2020                K                        4.2                 07/21/2020                CO2                      27                  07/21/2020                GLUCOSE                  115 (H)             07/21/2020                BUN                      17                  07/21/2020                CREATININE               1.04                07/21/2020                CALCIUM                  9.2                 07/21/2020                GFRNONAA                 >60                 07/21/2020          )        Anesthesia Quick Evaluation

## 2020-07-23 NOTE — Anesthesia Postprocedure Evaluation (Signed)
Anesthesia Post Note  Patient: Marvin Zamora.  Procedure(s) Performed: PAROTIDECTOMY (Right: Neck)     Patient location during evaluation: PACU Anesthesia Type: General Level of consciousness: awake and alert Pain management: pain level controlled Vital Signs Assessment: post-procedure vital signs reviewed and stable Respiratory status: spontaneous breathing, nonlabored ventilation, respiratory function stable and patient connected to nasal cannula oxygen Cardiovascular status: blood pressure returned to baseline and stable Postop Assessment: no apparent nausea or vomiting Anesthetic complications: no   No notable events documented.  Last Vitals:  Vitals:   07/23/20 1415 07/23/20 1430  BP: (!) 155/82 (!) 169/91  Pulse: 63 66  Resp: 17 14  Temp:    SpO2: 96% 96%    Last Pain:  Vitals:   07/23/20 1430  TempSrc:   PainSc: 0-No pain                 Belenda Cruise P Zanita Millman

## 2020-07-23 NOTE — Op Note (Signed)
Operative Note: PAROTIDECTOMY  Patient: Marvin Zamora.  Medical record number: [id number]  Date:07/23/2020  Pre-operative Indications: Right parotid Mass  Postoperative Indications: Same  Surgical Procedure: Right superficial Parotidectomy with NIMS Monitoring  Anesthesia: GET  Surgeon: Delsa Bern, M.D.  Assist: RNFA  Complications: None  EBL: Less than 50 cc   Brief History: The patient is a 75 y.o. male with a history of gradually enlarging right parotid mass.  CT scan showed a soft tissue mass involving the superficial aspect of the right parotid gland.  Radiographic findings consistent with a possible lipoma versus soft tissue tumor, no evidence of other abnormality such as lymphadenopathy, stones or inflammatory changes. Given the patient's history and findings I recommended right parotidectomy with nerve monitoring under general anesthesia.  The  risks and benefits were discussed in detail with the patient and their family. They understand and agree with our plan for surgery which is scheduled at [facility] on an elective basis.  Surgical Procedure: The patient is brought to the operating room on 07/23/2020 and placed in supine position on the operating table. General endotracheal anesthesia was established without difficulty. When the patient was adequately anesthetized, surgical timeout was performed and correct identification of the patient and the surgical procedure.  The patient was injected with 5 cc of 1% lidocaine 1-100,000 dilution epinephrine.  The [employer] Nerve Integrity Monitoring System (NIMS) was placed and nerve monitoring was used throughout the facial nerve dissection component of the surgical procedure.  The patient was positioned and prepped and draped in sterile fashion.  With the patient prepped and positioned, right superficial parotidectomy was undertaken.  A curvilinear incision was created in the preauricular skin crease and carried inferiorly  around the earlobe and into the upper neck in a pre-existing skin crease using a #15 scalpel.  Subcutaneous soft tissue was then dissected and incised with Bovie electrocautery to the level of the superficial parotid fascia.  Dissection was carried along the superficial fascia from posterior to anterior elevating skin and muscle layer.  The palpable soft tissue mass involving the soft tissue adjacent to and including the lateral fascia of the right parotid gland.  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  The abnormal fatty tissue was completely resected preserving the overlying skin.  Dissection was then carried into the parotid gland itself and superficial fascia and superficial component of the parotid gland were resected and sent to pathology for gross microscopic evaluation.  This created a more complete specimen with less risk of recurrence.  There was no palpable deep mass and further facial nerve dissection was not undertaken.  The facial nerve was then stimulated using the NIMS probe and all branches stimulated appropriately at 0.5 mV.  The parotid bed was then irrigated, hemostasis was maintained with bipolar cautery.  The patient's incision was then closed in multiple layers beginning with reapproximation of the periparotid fascia using interrupted 4-0 Vicryl.  The immediate subcutaneous tissue was closed with interrupted 5-0 Vicryl suture.  The final skin closure was obtained with Dermabond surgical glue.  An orogastric tube was passed and stomach contents were aspirated. Patient was awakened from anesthetic and transferred from the operating room to the recovery room in stable condition. There were no complications and blood loss was minimal.   Delsa Bern, M.D. [facility]
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
## 2020-07-23 NOTE — Anesthesia Procedure Notes (Signed)
Procedure Name: Intubation Date/Time: 07/23/2020 12:17 PM Performed by: Leonor Liv, CRNA Pre-anesthesia Checklist: Patient identified, Emergency Drugs available, Suction available and Patient being monitored Patient Re-evaluated:Patient Re-evaluated prior to induction Oxygen Delivery Method: Circle System Utilized Preoxygenation: Pre-oxygenation with 100% oxygen Induction Type: IV induction Ventilation: Mask ventilation without difficulty Laryngoscope Size: Glidescope and 4 Grade View: Grade I Tube type: Oral Tube size: 7.0 mm Number of attempts: 1 Airway Equipment and Method: Oral airway, Rigid stylet and Video-laryngoscopy Placement Confirmation: ETT inserted through vocal cords under direct vision, positive ETCO2 and breath sounds checked- equal and bilateral Secured at: 23 cm Tube secured with: Tape Dental Injury: Teeth and Oropharynx as per pre-operative assessment  Difficulty Due To: Difficulty was anticipated Comments: Hx OSA

## 2020-07-24 ENCOUNTER — Encounter (HOSPITAL_COMMUNITY): Payer: Self-pay | Admitting: Otolaryngology

## 2020-07-24 LAB — SURGICAL PATHOLOGY

## 2020-07-29 ENCOUNTER — Encounter (HOSPITAL_COMMUNITY): Payer: Self-pay | Admitting: Otolaryngology

## 2020-09-26 DIAGNOSIS — G4733 Obstructive sleep apnea (adult) (pediatric): Secondary | ICD-10-CM | POA: Diagnosis not present

## 2020-10-07 ENCOUNTER — Encounter: Payer: Self-pay | Admitting: Neurology

## 2020-10-10 DIAGNOSIS — R7301 Impaired fasting glucose: Secondary | ICD-10-CM | POA: Diagnosis not present

## 2020-10-10 DIAGNOSIS — I10 Essential (primary) hypertension: Secondary | ICD-10-CM | POA: Diagnosis not present

## 2020-10-10 DIAGNOSIS — Z125 Encounter for screening for malignant neoplasm of prostate: Secondary | ICD-10-CM | POA: Diagnosis not present

## 2020-10-10 DIAGNOSIS — Z79899 Other long term (current) drug therapy: Secondary | ICD-10-CM | POA: Diagnosis not present

## 2020-10-17 DIAGNOSIS — I69959 Hemiplegia and hemiparesis following unspecified cerebrovascular disease affecting unspecified side: Secondary | ICD-10-CM | POA: Diagnosis not present

## 2020-10-17 DIAGNOSIS — R82998 Other abnormal findings in urine: Secondary | ICD-10-CM | POA: Diagnosis not present

## 2020-10-17 DIAGNOSIS — G4733 Obstructive sleep apnea (adult) (pediatric): Secondary | ICD-10-CM | POA: Diagnosis not present

## 2020-10-17 DIAGNOSIS — Z1389 Encounter for screening for other disorder: Secondary | ICD-10-CM | POA: Diagnosis not present

## 2020-10-17 DIAGNOSIS — E669 Obesity, unspecified: Secondary | ICD-10-CM | POA: Diagnosis not present

## 2020-10-17 DIAGNOSIS — Z1212 Encounter for screening for malignant neoplasm of rectum: Secondary | ICD-10-CM | POA: Diagnosis not present

## 2020-10-17 DIAGNOSIS — I639 Cerebral infarction, unspecified: Secondary | ICD-10-CM | POA: Diagnosis not present

## 2020-10-17 DIAGNOSIS — I10 Essential (primary) hypertension: Secondary | ICD-10-CM | POA: Diagnosis not present

## 2020-10-17 DIAGNOSIS — Z23 Encounter for immunization: Secondary | ICD-10-CM | POA: Diagnosis not present

## 2020-10-17 DIAGNOSIS — Z1331 Encounter for screening for depression: Secondary | ICD-10-CM | POA: Diagnosis not present

## 2020-10-17 DIAGNOSIS — M25551 Pain in right hip: Secondary | ICD-10-CM | POA: Diagnosis not present

## 2020-10-17 DIAGNOSIS — Z0000 Encounter for general adult medical examination without abnormal findings: Secondary | ICD-10-CM | POA: Diagnosis not present

## 2020-10-17 DIAGNOSIS — R7301 Impaired fasting glucose: Secondary | ICD-10-CM | POA: Diagnosis not present

## 2020-10-22 ENCOUNTER — Encounter: Payer: Self-pay | Admitting: Neurology

## 2020-10-26 DIAGNOSIS — G4733 Obstructive sleep apnea (adult) (pediatric): Secondary | ICD-10-CM | POA: Diagnosis not present

## 2020-10-27 ENCOUNTER — Encounter: Payer: Self-pay | Admitting: Neurology

## 2020-10-27 ENCOUNTER — Ambulatory Visit: Payer: PPO | Admitting: Neurology

## 2020-10-27 VITALS — BP 152/77 | HR 61 | Ht 72.0 in | Wt 233.5 lb

## 2020-10-27 DIAGNOSIS — Z87820 Personal history of traumatic brain injury: Secondary | ICD-10-CM | POA: Diagnosis not present

## 2020-10-27 DIAGNOSIS — J342 Deviated nasal septum: Secondary | ICD-10-CM

## 2020-10-27 DIAGNOSIS — Z9989 Dependence on other enabling machines and devices: Secondary | ICD-10-CM | POA: Diagnosis not present

## 2020-10-27 DIAGNOSIS — G4733 Obstructive sleep apnea (adult) (pediatric): Secondary | ICD-10-CM
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
## 2020-10-27 NOTE — Patient Instructions (Signed)

## 2020-11-26 DIAGNOSIS — G4733 Obstructive sleep apnea (adult) (pediatric): Secondary | ICD-10-CM | POA: Diagnosis not present

## 2020-12-10 DIAGNOSIS — L821 Other seborrheic keratosis: Secondary | ICD-10-CM | POA: Diagnosis not present

## 2020-12-10 DIAGNOSIS — C44319 Basal cell carcinoma of skin of other parts of face: Secondary | ICD-10-CM | POA: Diagnosis not present

## 2020-12-10 DIAGNOSIS — L723 Sebaceous cyst: Secondary | ICD-10-CM | POA: Diagnosis not present

## 2020-12-10 DIAGNOSIS — D485 Neoplasm of uncertain behavior of skin: Secondary | ICD-10-CM | POA: Diagnosis not present

## 2020-12-10 DIAGNOSIS — L570 Actinic keratosis: Secondary | ICD-10-CM | POA: Diagnosis not present

## 2020-12-26 DIAGNOSIS — G4733 Obstructive sleep apnea (adult) (pediatric): Secondary | ICD-10-CM | POA: Diagnosis not present

## 2021-01-19 DIAGNOSIS — C44319 Basal cell carcinoma of skin of other parts of face: Secondary | ICD-10-CM | POA: Diagnosis not present

## 2021-01-26 DIAGNOSIS — G4733 Obstructive sleep apnea (adult) (pediatric): Secondary | ICD-10-CM | POA: Diagnosis not present

## 2021-01-30 DIAGNOSIS — G4733 Obstructive sleep apnea (adult) (pediatric): Secondary | ICD-10-CM | POA: Diagnosis not present

## 2021-02-23 DIAGNOSIS — H40013 Open angle with borderline findings, low risk, bilateral: Secondary | ICD-10-CM | POA: Diagnosis not present

## 2021-02-23 DIAGNOSIS — Z961 Presence of intraocular lens: Secondary | ICD-10-CM | POA: Diagnosis not present

## 2021-02-23 DIAGNOSIS — H52203 Unspecified astigmatism, bilateral: Secondary | ICD-10-CM | POA: Diagnosis not present

## 2021-02-26 DIAGNOSIS — G4733 Obstructive sleep apnea (adult) (pediatric): Secondary | ICD-10-CM | POA: Diagnosis not present

## 2021-03-26 DIAGNOSIS — G4733 Obstructive sleep apnea (adult) (pediatric): Secondary | ICD-10-CM | POA: Diagnosis not present

## 2021-04-26 DIAGNOSIS — G4733 Obstructive sleep apnea (adult) (pediatric): Secondary | ICD-10-CM | POA: Diagnosis not present

## 2021-05-04 ENCOUNTER — Ambulatory Visit: Payer: PPO | Admitting: Adult Health

## 2021-05-25 ENCOUNTER — Ambulatory Visit: Payer: PPO | Admitting: Adult Health

## 2021-05-25 ENCOUNTER — Telehealth: Payer: Self-pay | Admitting: *Deleted

## 2021-05-25 VITALS — BP 151/66 | HR 60 | Ht 72.0 in | Wt 229.2 lb

## 2021-05-25 DIAGNOSIS — Z9989 Dependence on other enabling machines and devices: Secondary | ICD-10-CM | POA: Diagnosis not present

## 2021-05-25 DIAGNOSIS — G4733 Obstructive sleep apnea (adult) (pediatric): Secondary | ICD-10-CM | POA: Diagnosis not present
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
## 2021-05-25 NOTE — Telephone Encounter (Signed)
Fax confirmation received to [facility] for transfer to new DME request.  [phone].  ?

## 2021-05-25 NOTE — Patient Instructions (Addendum)
Continue using CPAP nightly and greater than 4 hours each night ?Order sent for new DME company ?If your symptoms worsen or you develop new symptoms please let us know.  ? ?

## 2021-05-26 DIAGNOSIS — G4733 Obstructive sleep apnea (adult) (pediatric): Secondary | ICD-10-CM | POA: Diagnosis not present

## 2021-06-02 NOTE — Telephone Encounter (Signed)
Received fax from [facility] adv approved for DME cpap supplies. 05-19-21 thru 12-07-21.  Auth # [id number].

## 2021-06-03 NOTE — Telephone Encounter (Addendum)
Spoke to [facility] Rep  Informed in order  to service CPAP machine  need a new  order with CPAP pressure settings and new supplies on order . Will make Megan,NP Aware.

## 2021-06-04 NOTE — Telephone Encounter (Signed)
Pt called back and was made aware of plan to get orders sent to [facility] before they can assume care. He said [facility] has agreed to send his machine [facility] of the humidity function. He states he was approved by insurance for a humidifier but he doesn't need that. He says his home humidity is fine. He states his Resmed 10 machine worked fine and has the same function as the 11 but the 11 runs out of water within 4-5 hours. Pt verbalized appreciation.

## 2021-06-09 NOTE — Telephone Encounter (Signed)
Spoke with patient. [facility] has sent his machine off to be looked at and it will be about a month until he gets it back. He is still ok with switching to [facility] but wonders if we should wait since he doesn't have his machine. Will confirm if ok with MM NP, otherwise we would need to send machine setting and supply orders over to [facility]. The pt plans to call us when he gets his machine back.

## 2021-06-09 NOTE — Telephone Encounter (Signed)
Anything you need from me?

## 2021-06-26 DIAGNOSIS — G4733 Obstructive sleep apnea (adult) (pediatric): Secondary | ICD-10-CM | POA: Diagnosis not present

## 2021-07-08 ENCOUNTER — Encounter: Payer: Self-pay | Admitting: Gastroenterology

## 2021-07-26 DIAGNOSIS — G4733 Obstructive sleep apnea (adult) (pediatric): Secondary | ICD-10-CM | POA: Diagnosis not present

## 2021-08-07 ENCOUNTER — Encounter: Payer: Self-pay | Admitting: Adult Health

## 2021-08-26 DIAGNOSIS — G4733 Obstructive sleep apnea (adult) (pediatric): Secondary | ICD-10-CM | POA: Diagnosis not present

## 2021-08-28 ENCOUNTER — Encounter: Payer: Self-pay | Admitting: Gastroenterology

## 2021-08-28 ENCOUNTER — Ambulatory Visit: Payer: PPO | Admitting: Gastroenterology

## 2021-08-28 VITALS — BP 114/68 | HR 60 | Ht 72.0 in | Wt 223.2 lb

## 2021-08-28 DIAGNOSIS — Z86010 Personal history of colonic polyps: Secondary | ICD-10-CM

## 2021-08-28 NOTE — Patient Instructions (Signed)
_______________________________________________________  If you are age 78 or older, your body mass index should be between 23-30. Your Body mass index is 30.28 kg/m. If this is out of the aforementioned range listed, please consider follow up with your Primary Care Provider.  If you are age 42 or younger, your body mass index should be between 19-25. Your Body mass index is 30.28 kg/m. If this is out of the aformentioned range listed, please consider follow up with your Primary Care Provider.   ________________________________________________________  The [facility] providers would like to encourage you to use [facility] to communicate with providers for non-urgent requests or questions.  Due to long hold times on the telephone, sending your provider a message by [facility] may be a faster and more efficient way to get a response.  Please allow 48 business hours for a response.  Please remember that this is for non-urgent requests.  _______________________________________________________  We will reach out to you in a few weeks when our November 2023 schedule is available.   It was a pleasure to see you today!  Thank you for trusting me with your gastrointestinal care!
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
## 2021-09-11 DIAGNOSIS — G4733 Obstructive sleep apnea (adult) (pediatric): Secondary | ICD-10-CM | POA: Diagnosis not present

## 2021-09-26 DIAGNOSIS — G4733 Obstructive sleep apnea (adult) (pediatric): Secondary | ICD-10-CM | POA: Diagnosis not present

## 2021-09-28 ENCOUNTER — Encounter: Payer: Self-pay | Admitting: Gastroenterology

## 2021-10-12 ENCOUNTER — Ambulatory Visit (AMBULATORY_SURGERY_CENTER): Payer: Self-pay

## 2021-10-12 ENCOUNTER — Other Ambulatory Visit: Payer: Self-pay

## 2021-10-12 VITALS — Ht 72.0 in | Wt 220.0 lb

## 2021-10-12 DIAGNOSIS — Z86010 Personal history of colonic polyps: Secondary | ICD-10-CM

## 2021-10-12 MED ORDER — PEG 3350-KCL-NA BICARB-NACL 420 G PO SOLR: 4000.0000 mL | Freq: Once | ORAL | 0 refills | Status: AC

## 2021-10-12 NOTE — Progress Notes (Signed)
Denies allergies to eggs or soy products. Denies complication of anesthesia or sedation. Denies use of weight loss medication. Denies use of O2.   Emmi instructions given for colonoscopy.  

## 2021-10-13 ENCOUNTER — Encounter: Payer: Self-pay | Admitting: Gastroenterology

## 2021-10-23 ENCOUNTER — Ambulatory Visit (AMBULATORY_SURGERY_CENTER): Payer: PPO | Admitting: Gastroenterology

## 2021-10-23 ENCOUNTER — Encounter: Payer: Self-pay | Admitting: Gastroenterology

## 2021-10-23 VITALS — BP 135/73 | HR 48 | Temp 98.0°F | Resp 11 | Ht 72.0 in | Wt 220.0 lb

## 2021-10-23 DIAGNOSIS — Z86010 Personal history of colonic polyps: Secondary | ICD-10-CM

## 2021-10-23 DIAGNOSIS — Z09 Encounter for follow-up examination after completed treatment for conditions other than malignant neoplasm: Secondary | ICD-10-CM | POA: Diagnosis not present

## 2021-10-23 DIAGNOSIS — I10 Essential (primary) hypertension: Secondary | ICD-10-CM | POA: Diagnosis not present

## 2021-10-23 DIAGNOSIS — K648 Other hemorrhoids: Secondary | ICD-10-CM | POA: Diagnosis not present

## 2021-10-23 DIAGNOSIS — G4733 Obstructive sleep apnea (adult) (pediatric): Secondary | ICD-10-CM | POA: Diagnosis not present

## 2021-10-23 MED ORDER — SODIUM CHLORIDE 0.9 % IV SOLN: 500.0000 mL | Freq: Once | INTRAVENOUS | Status: DC

## 2021-10-23 NOTE — Patient Instructions (Signed)
YOU HAD AN ENDOSCOPIC PROCEDURE TODAY AT THE [facility]:   Refer to the procedure report that was given to you for any specific questions about what was found during the examination.  If the procedure report does not answer your questions, please call your gastroenterologist to clarify.  If you requested that your care partner not be given the details of your procedure findings, then the procedure report has been included in a sealed envelope for you to review at your convenience later.  YOU SHOULD EXPECT: Some feelings of bloating in the abdomen. Passage of more gas than usual.  Walking can help get rid of the air that was put into your GI tract during the procedure and reduce the bloating. If you had a lower endoscopy (such as a colonoscopy or flexible sigmoidoscopy) you may notice spotting of blood in your stool or on the toilet paper. If you underwent a bowel prep for your procedure, you may not have a normal bowel movement for a few days.  Please Note:  You might notice some irritation and congestion in your nose or some drainage.  This is from the oxygen used during your procedure.  There is no need for concern and it should clear up in a day or so.  SYMPTOMS TO REPORT IMMEDIATELY:  Following lower endoscopy (colonoscopy or flexible sigmoidoscopy):  Excessive amounts of blood in the stool  Significant tenderness or worsening of abdominal pains  Swelling of the abdomen that is new, acute  Fever of 100F or higher  For urgent or emergent issues, a gastroenterologist can be reached at any hour by calling [phone]. Do not use MyChart messaging for urgent concerns.    DIET:  We do recommend a small meal at first, but then you may proceed to your regular diet.  Drink plenty of fluids but you should avoid alcoholic beverages for 24 hours.  ACTIVITY:  You should plan to take it easy for the rest of today and you should NOT DRIVE or use heavy machinery until tomorrow (because of  the sedation medicines used during the test).    FOLLOW UP: Our staff will call the number listed on your records the next business day following your procedure.  We will call around 7:15- 8:00 am to check on you and address any questions or concerns that you may have regarding the information given to you following your procedure. If we do not reach you, we will leave a message.     If any biopsies were taken you will be contacted by phone or by letter within the next 1-3 weeks.  Please call us at [phone] if you have not heard about the biopsies in 3 weeks.    SIGNATURES/CONFIDENTIALITY: You and/or your care partner have signed paperwork which will be entered into your electronic medical record.  These signatures attest to the fact that that the information above on your After Visit Summary has been reviewed and is understood.  Full responsibility of the confidentiality of this discharge information lies with you and/or your care-partner.  

## 2021-10-23 NOTE — Progress Notes (Signed)
Report to PACU, RN, vss, BBS= Clear.  
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
## 2021-10-23 NOTE — Progress Notes (Signed)
Pt's states no medical or surgical changes since previsit or office visit. 

## 2021-10-23 NOTE — Op Note (Signed)
Marvin Zamora Patient Name: Marvin Zamora Procedure Date: 10/23/2021 2:49 PM MRN: [id number] Endoscopist: Warren. Loletha Carrow , MD Age: 76 Referring MD:  Date of Birth: 25-Nov-1945 Gender: Male Account #: [id number] Procedure:                Colonoscopy Indications:              Surveillance: Personal history of adenomatous                            polyps on last colonoscopy > 3 years ago                           TA x 7 (at least one > 30m) July 2020 - fair prep                            on that exam Medicines:                Monitored Anesthesia Care Procedure:                Pre-Anesthesia Assessment:                           - Prior to the procedure, a History and Physical                            was performed, and patient medications and                            allergies were reviewed. The patient's tolerance of                            previous anesthesia was also reviewed. The risks                            and benefits of the procedure and the sedation                            options and risks were discussed with the patient.                            All questions were answered, and informed consent                            was obtained. Prior Anticoagulants: The patient has                            taken no previous anticoagulant or antiplatelet                            agents. ASA Grade Assessment: II - A patient with                            mild systemic disease. After reviewing the risks  and benefits, the patient was deemed in                            satisfactory condition to undergo the procedure.                           After obtaining informed consent, the colonoscope                            was passed under direct vision. Throughout the                            procedure, the patient's blood pressure, pulse, and                            oxygen saturations were monitored continuously. The                             CF [id number] #[id number] was introduced through the anus                            and advanced to the the cecum, identified by                            appendiceal orifice and ileocecal valve. The                            colonoscopy was performed without difficulty. The                            patient tolerated the procedure well. The quality                            of the bowel preparation was generally good after                            lavage (some residual diverticular stool balls).                            The ileocecal valve, appendiceal orifice, and                            rectum were photographed. The bowel preparation                            used was GoLYTELY. Scope In: 3:40:48 PM Scope Out: 3:55:29 PM Scope Withdrawal Time: 0 hours 8 minutes 31 seconds  Total Procedure Duration: 0 hours 14 minutes 41 seconds  Findings:                 The perianal and digital rectal examinations were                            normal.  Repeat examination of right colon under NBI                            performed.                           There is no endoscopic evidence of polyps in the                            entire colon.                           Many diverticula were found from ascending colon to                            sigmoid colon.                           Internal hemorrhoids were found.                           The exam was otherwise without abnormality on                            direct and retroflexion views. Complications:            No immediate complications. Estimated Blood Loss:     Estimated blood loss: none. Impression:               - Diverticulosis from ascending colon to sigmoid                            colon.                           - Internal hemorrhoids.                           - The examination was otherwise normal on direct                            and retroflexion views.                            - No specimens collected. Recommendation:           - Patient has a contact number available for                            emergencies. The signs and symptoms of potential                            delayed complications were discussed with the                            patient. Return to normal activities tomorrow.  Written discharge instructions were provided to the                            patient.                           - Resume previous diet.                           - Continue present medications.                           - No repeat routine surveillance colonoscopy                            recommended. Kwane Rohl L. Loletha Carrow, MD 10/23/2021 4:02:05 PM This report has been signed electronically.

## 2021-10-26 ENCOUNTER — Telehealth: Payer: Self-pay

## 2021-10-26 NOTE — Telephone Encounter (Signed)
Post procedure follow up call, no answer 

## 2021-11-06 DIAGNOSIS — I10 Essential (primary) hypertension: Secondary | ICD-10-CM | POA: Diagnosis not present

## 2021-11-06 DIAGNOSIS — R7989 Other specified abnormal findings of blood chemistry: Secondary | ICD-10-CM | POA: Diagnosis not present

## 2021-11-06 DIAGNOSIS — Z125 Encounter for screening for malignant neoplasm of prostate: Secondary | ICD-10-CM | POA: Diagnosis not present

## 2021-11-06 DIAGNOSIS — R7301 Impaired fasting glucose: Secondary | ICD-10-CM | POA: Diagnosis not present

## 2021-11-13 DIAGNOSIS — G4733 Obstructive sleep apnea (adult) (pediatric): Secondary | ICD-10-CM | POA: Diagnosis not present

## 2021-11-13 DIAGNOSIS — M25551 Pain in right hip: Secondary | ICD-10-CM | POA: Diagnosis not present

## 2021-11-13 DIAGNOSIS — R82998 Other abnormal findings in urine: Secondary | ICD-10-CM | POA: Diagnosis not present

## 2021-11-13 DIAGNOSIS — I639 Cerebral infarction, unspecified: Secondary | ICD-10-CM | POA: Diagnosis not present

## 2021-11-13 DIAGNOSIS — N529 Male erectile dysfunction, unspecified: Secondary | ICD-10-CM | POA: Diagnosis not present

## 2021-11-13 DIAGNOSIS — Z23 Encounter for immunization: Secondary | ICD-10-CM | POA: Diagnosis not present

## 2021-11-13 DIAGNOSIS — M791 Myalgia, unspecified site: Secondary | ICD-10-CM | POA: Diagnosis not present

## 2021-11-13 DIAGNOSIS — I10 Essential (primary) hypertension: Secondary | ICD-10-CM | POA: Diagnosis not present

## 2021-11-13 DIAGNOSIS — E669 Obesity, unspecified: Secondary | ICD-10-CM | POA: Diagnosis not present

## 2021-11-13 DIAGNOSIS — R7301 Impaired fasting glucose: Secondary | ICD-10-CM | POA: Diagnosis not present

## 2021-11-13 DIAGNOSIS — Z0000 Encounter for general adult medical examination without abnormal findings: Secondary | ICD-10-CM | POA: Diagnosis not present

## 2021-11-13 DIAGNOSIS — J309 Allergic rhinitis, unspecified: Secondary | ICD-10-CM | POA: Diagnosis not present

## 2021-11-13 DIAGNOSIS — K5792 Diverticulitis of intestine, part unspecified, without perforation or abscess without bleeding: Secondary | ICD-10-CM | POA: Diagnosis not present

## 2021-11-13 DIAGNOSIS — Z1339 Encounter for screening examination for other mental health and behavioral disorders: Secondary | ICD-10-CM | POA: Diagnosis not present

## 2021-11-13 DIAGNOSIS — K649 Unspecified hemorrhoids: Secondary | ICD-10-CM | POA: Diagnosis not present

## 2021-11-13 DIAGNOSIS — I69959 Hemiplegia and hemiparesis following unspecified cerebrovascular disease affecting unspecified side: Secondary | ICD-10-CM | POA: Diagnosis not present

## 2021-11-13 DIAGNOSIS — Z1331 Encounter for screening for depression: Secondary | ICD-10-CM | POA: Diagnosis not present

## 2021-12-14 DIAGNOSIS — L821 Other seborrheic keratosis: Secondary | ICD-10-CM | POA: Diagnosis not present

## 2021-12-14 DIAGNOSIS — D1721 Benign lipomatous neoplasm of skin and subcutaneous tissue of right arm: Secondary | ICD-10-CM | POA: Diagnosis not present

## 2021-12-14 DIAGNOSIS — D1801 Hemangioma of skin and subcutaneous tissue: Secondary | ICD-10-CM | POA: Diagnosis not present

## 2021-12-14 DIAGNOSIS — L570 Actinic keratosis: Secondary | ICD-10-CM | POA: Diagnosis not present

## 2021-12-14 DIAGNOSIS — L720 Epidermal cyst: Secondary | ICD-10-CM | POA: Diagnosis not present

## 2021-12-14 DIAGNOSIS — D2261 Melanocytic nevi of right upper limb, including shoulder: Secondary | ICD-10-CM | POA: Diagnosis not present

## 2021-12-14 DIAGNOSIS — D225 Melanocytic nevi of trunk: Secondary | ICD-10-CM | POA: Diagnosis not present

## 2021-12-14 DIAGNOSIS — D2262 Melanocytic nevi of left upper limb, including shoulder: Secondary | ICD-10-CM | POA: Diagnosis not present

## 2021-12-14 DIAGNOSIS — Z85828 Personal history of other malignant neoplasm of skin: Secondary | ICD-10-CM | POA: Diagnosis not present

## 2021-12-31 DIAGNOSIS — M25551 Pain in right hip: Secondary | ICD-10-CM | POA: Diagnosis not present

## 2021-12-31 DIAGNOSIS — M353 Polymyalgia rheumatica: Secondary | ICD-10-CM | POA: Diagnosis not present

## 2021-12-31 DIAGNOSIS — M25512 Pain in left shoulder: Secondary | ICD-10-CM | POA: Diagnosis not present

## 2021-12-31 DIAGNOSIS — M25511 Pain in right shoulder: Secondary | ICD-10-CM | POA: Diagnosis not present

## 2021-12-31 DIAGNOSIS — M25552 Pain in left hip: Secondary | ICD-10-CM | POA: Diagnosis not present

## 2021-12-31 DIAGNOSIS — M25559 Pain in unspecified hip: Secondary | ICD-10-CM | POA: Diagnosis not present

## 2021-12-31 DIAGNOSIS — M1990 Unspecified osteoarthritis, unspecified site: Secondary | ICD-10-CM | POA: Diagnosis not present

## 2022-01-28 ENCOUNTER — Other Ambulatory Visit (HOSPITAL_COMMUNITY): Payer: Self-pay

## 2022-01-29 ENCOUNTER — Other Ambulatory Visit: Payer: Self-pay

## 2022-01-29 ENCOUNTER — Other Ambulatory Visit (HOSPITAL_COMMUNITY): Payer: Self-pay

## 2022-01-29 MED ORDER — AMLODIPINE BESYLATE 5 MG PO TABS
5.0000 mg | ORAL_TABLET | Freq: Every day | ORAL | 3 refills | Status: DC
  Filled 2022-01-29: qty 100, 100d supply, fill #0
  Filled 2022-05-11: qty 100, 100d supply, fill #1
  Filled 2022-09-05: qty 100, 100d supply, fill #2
  Filled 2022-12-10: qty 100, 100d supply, fill #3

## 2022-01-29 MED ORDER — LISINOPRIL-HYDROCHLOROTHIAZIDE 20-25 MG PO TABS
1.0000 | ORAL_TABLET | Freq: Every day | ORAL | 3 refills | Status: DC
  Filled 2022-01-29: qty 100, 100d supply, fill #0
  Filled 2022-05-11: qty 100, 100d supply, fill #1
  Filled 2022-09-05: qty 100, 100d supply, fill #2
  Filled 2022-12-10: qty 100, 100d supply, fill #3

## 2022-01-29 MED ORDER — OMEPRAZOLE 40 MG PO CPDR
40.0000 mg | DELAYED_RELEASE_CAPSULE | Freq: Every day | ORAL | 3 refills | Status: DC
  Filled 2022-01-29: qty 100, 100d supply, fill #0
  Filled 2022-05-11: qty 100, 100d supply, fill #1
  Filled 2022-09-05: qty 100, 100d supply, fill #2
  Filled 2022-12-10: qty 100, 100d supply, fill #3

## 2022-01-29 MED ORDER — ATORVASTATIN CALCIUM 10 MG PO TABS
10.0000 mg | ORAL_TABLET | Freq: Every day | ORAL | 3 refills | Status: DC
  Filled 2022-01-29: qty 100, 100d supply, fill #0
  Filled 2022-05-11: qty 100, 100d supply, fill #1
  Filled 2022-09-05: qty 100, 100d supply, fill #2
  Filled 2022-12-10: qty 100, 100d supply, fill #3

## 2022-02-01 DIAGNOSIS — M25559 Pain in unspecified hip: Secondary | ICD-10-CM | POA: Diagnosis not present

## 2022-02-01 DIAGNOSIS — M25511 Pain in right shoulder: Secondary | ICD-10-CM | POA: Diagnosis not present

## 2022-02-01 DIAGNOSIS — M1990 Unspecified osteoarthritis, unspecified site: Secondary | ICD-10-CM | POA: Diagnosis not present

## 2022-02-01 DIAGNOSIS — M353 Polymyalgia rheumatica: Secondary | ICD-10-CM | POA: Diagnosis not present

## 2022-03-02 DIAGNOSIS — Z961 Presence of intraocular lens: Secondary | ICD-10-CM | POA: Diagnosis not present

## 2022-03-02 DIAGNOSIS — H40022 Open angle with borderline findings, high risk, left eye: Secondary | ICD-10-CM | POA: Diagnosis not present

## 2022-03-02 DIAGNOSIS — H52203 Unspecified astigmatism, bilateral: Secondary | ICD-10-CM | POA: Diagnosis not present

## 2022-03-26 DIAGNOSIS — K2970 Gastritis, unspecified, without bleeding: Secondary | ICD-10-CM | POA: Diagnosis not present

## 2022-03-26 DIAGNOSIS — I10 Essential (primary) hypertension: Secondary | ICD-10-CM | POA: Diagnosis not present

## 2022-03-26 DIAGNOSIS — K5792 Diverticulitis of intestine, part unspecified, without perforation or abscess without bleeding: Secondary | ICD-10-CM | POA: Diagnosis not present

## 2022-03-26 DIAGNOSIS — I69959 Hemiplegia and hemiparesis following unspecified cerebrovascular disease affecting unspecified side: Secondary | ICD-10-CM | POA: Diagnosis not present

## 2022-03-26 DIAGNOSIS — R7301 Impaired fasting glucose: Secondary | ICD-10-CM | POA: Diagnosis not present

## 2022-03-26 DIAGNOSIS — I639 Cerebral infarction, unspecified: Secondary | ICD-10-CM | POA: Diagnosis not present

## 2022-03-26 DIAGNOSIS — M353 Polymyalgia rheumatica: Secondary | ICD-10-CM | POA: Diagnosis not present

## 2022-03-26 DIAGNOSIS — R198 Other specified symptoms and signs involving the digestive system and abdomen: Secondary | ICD-10-CM | POA: Diagnosis not present

## 2022-05-03 DIAGNOSIS — M1990 Unspecified osteoarthritis, unspecified site: Secondary | ICD-10-CM | POA: Diagnosis not present

## 2022-05-03 DIAGNOSIS — M353 Polymyalgia rheumatica: Secondary | ICD-10-CM | POA: Diagnosis not present

## 2022-05-03 DIAGNOSIS — M25559 Pain in unspecified hip: Secondary | ICD-10-CM | POA: Diagnosis not present

## 2022-05-03 DIAGNOSIS — M25511 Pain in right shoulder: Secondary | ICD-10-CM | POA: Diagnosis not present

## 2022-05-10 ENCOUNTER — Other Ambulatory Visit (HOSPITAL_COMMUNITY): Payer: Self-pay

## 2022-05-10 MED ORDER — PREDNISONE 5 MG PO TABS
5.0000 mg | ORAL_TABLET | Freq: Every day | ORAL | 1 refills | Status: DC
  Filled 2022-05-10: qty 30, 30d supply, fill #0
  Filled 2022-05-31 – 2022-06-05 (×2): qty 30, 30d supply, fill #1

## 2022-05-11 ENCOUNTER — Other Ambulatory Visit (HOSPITAL_COMMUNITY): Payer: Self-pay

## 2022-05-12 ENCOUNTER — Other Ambulatory Visit (HOSPITAL_COMMUNITY): Payer: Self-pay
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
## 2022-05-31 ENCOUNTER — Encounter: Payer: Self-pay | Admitting: Adult Health

## 2022-05-31 ENCOUNTER — Ambulatory Visit: Payer: PPO | Admitting: Adult Health

## 2022-05-31 VITALS — BP 158/76 | HR 52 | Ht 72.0 in | Wt 224.4 lb

## 2022-05-31 DIAGNOSIS — G4733 Obstructive sleep apnea (adult) (pediatric): Secondary | ICD-10-CM | POA: Diagnosis not present

## 2022-06-01 ENCOUNTER — Other Ambulatory Visit: Payer: Self-pay

## 2022-06-08 ENCOUNTER — Other Ambulatory Visit: Payer: Self-pay

## 2022-06-21 ENCOUNTER — Other Ambulatory Visit (HOSPITAL_COMMUNITY): Payer: Self-pay

## 2022-06-21 DIAGNOSIS — G4733 Obstructive sleep apnea (adult) (pediatric): Secondary | ICD-10-CM | POA: Diagnosis not present

## 2022-06-21 MED ORDER — PREDNISONE 5 MG PO TABS
5.0000 mg | ORAL_TABLET | Freq: Every day | ORAL | 0 refills | Status: DC
  Filled 2022-06-21 – 2022-07-05 (×3): qty 90, 90d supply, fill #0

## 2022-06-26 ENCOUNTER — Other Ambulatory Visit (HOSPITAL_COMMUNITY): Payer: Self-pay

## 2022-07-05 ENCOUNTER — Other Ambulatory Visit (HOSPITAL_COMMUNITY): Payer: Self-pay

## 2022-09-05 ENCOUNTER — Other Ambulatory Visit (HOSPITAL_COMMUNITY): Payer: Self-pay

## 2022-09-17 IMAGING — CT CT NECK W/ CM
3 of 4 series · 6 of 14 positions shown, 7 images · IV contrast (iopamidol)
Comparison: MRI brain 09/28/2012, MRA neck 10/23/2012.

CLINICAL DATA: Mass of right parotid gland. Additional history
provided by scanning technologist: Patient reports mass of right
parotid gland for several years, gradually increasing in size, no
pain.

Creatinine was obtained on site at [HOSPITAL] at [HOSPITAL].
Results: Creatinine 1.0 mg/dL (GFR 78).
EXAM:
CT NECK WITH CONTRAST
TECHNIQUE: Multidetector CT imaging of the neck was performed using the
standard protocol following the bolus administration of intravenous
contrast.
CONTRAST:  75mL TADZHO-I88 IOPAMIDOL (TADZHO-I88) INJECTION 61%

[Series 3: neck · axial · 0.54mm/px · z∈[-264,-174]mm · 2 of 135 slices shown]
[im 45/135  bone]
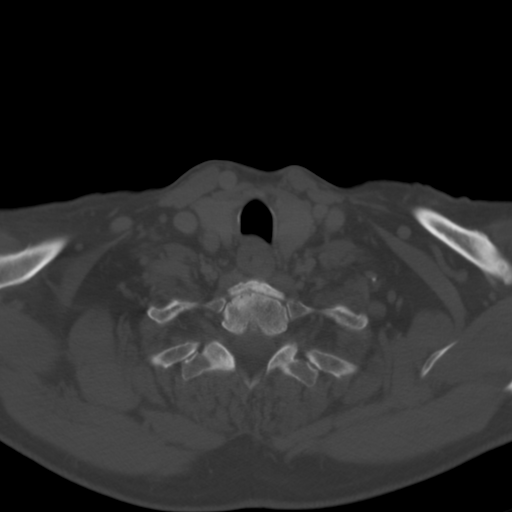
[im 90/135  bone]
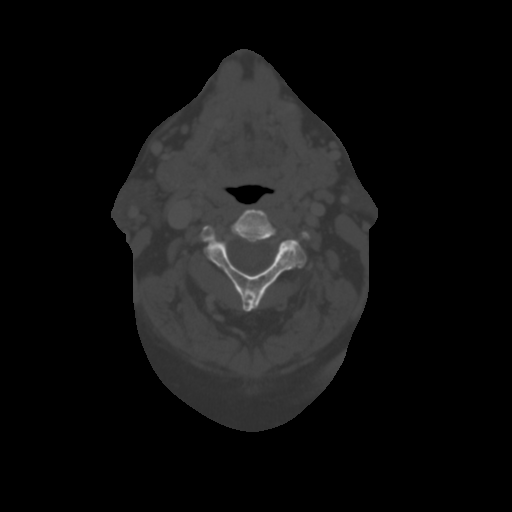

[Series 6: angled axial-oropharynx · axial · 0.52mm/px · z∈[-302,-212]mm · 2 of 143 slices shown, 3 images]
[im 48/143  soft-tissue]
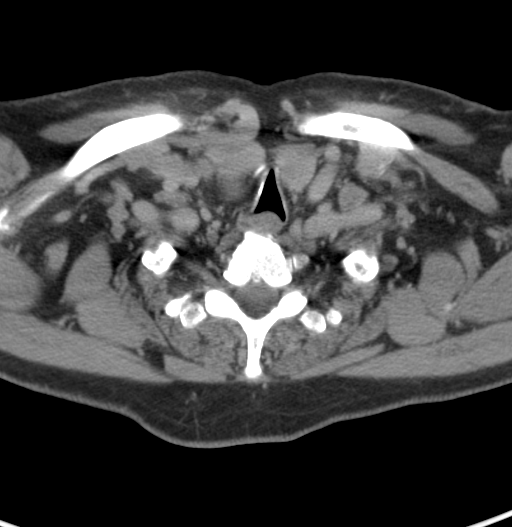
[im 48/143  bone]
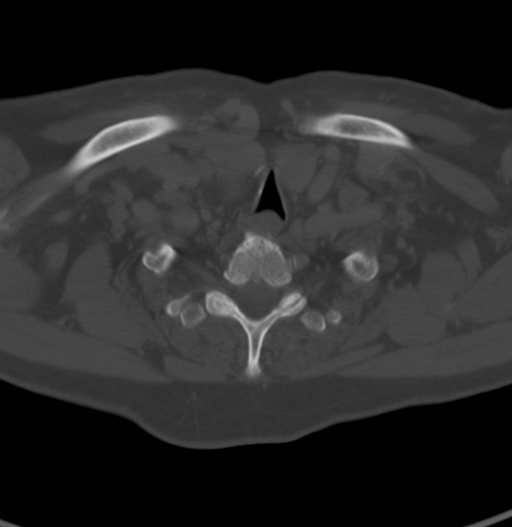
[im 95/143  bone]
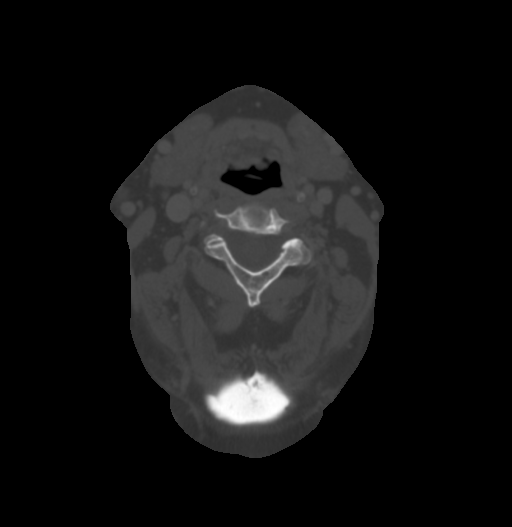

[Series 7: angled (person_name) · axial · 0.60mm/px · z∈[-158,-79]mm · 2 of 149 slices shown]
[im 50/149  bone]
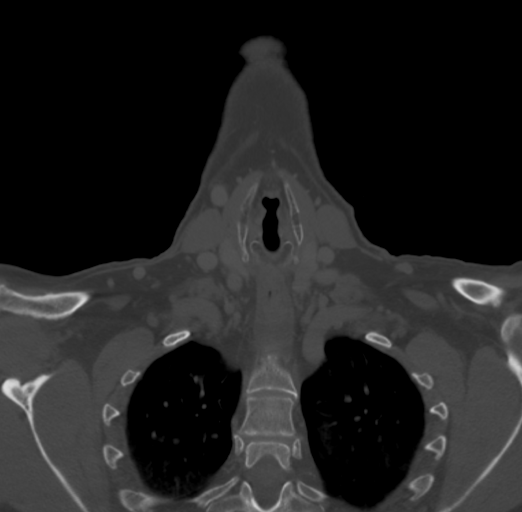
[im 99/149  bone]
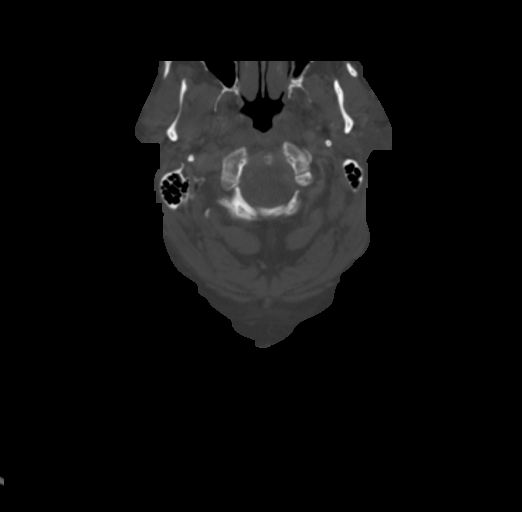

[6 of 14 positions shown; findings below may reference images not displayed]

FINDINGS: Pharynx and larynx: Streak artifact from dental restoration
partially obscures the oral cavity. No appreciable swelling or
discrete mass within the oral cavity, pharynx or larynx.
Postinflammatory calcifications within the palatine tonsils
bilaterally.

Salivary glands: The parotid and submandibular glands themselves are
unremarkable. There is an ovoid focus of fat density measuring 1.4 x
1.0 x 1.3 cm (AP x TV x CC) overlying the right parotid gland. There
is mild local mass effect upon the underlying right parotid gland,
and findings are compatible with lipoma (series 3, image 39) (series
5, image 64). This is located immediately deep to the right-sided
skin surface marker.

Thyroid: Unremarkable.

Lymph nodes: No pathologically enlarged cervical chain lymph nodes.

Vascular: The major vascular structures of the neck are patent.
Calcified plaque within the visualized aortic arch, proximal major
branch vessels of the neck and carotid bifurcations.

Limited intracranial: No acute intracranial abnormality identified

Visualized orbits: Excluded from the field of view.

Mastoids and visualized paranasal sinuses: Mild mucosal thickening
and small mucous retention cyst within the visualized right
maxillary sinus. No significant mastoid effusion.

Skeleton: No acute bony abnormality or aggressive osseous lesion.
Cervical spondylosis with multilevel disc space narrowing, disc
bulges, posterior disc osteophytes and uncovertebral hypertrophy.
Disc space narrowing is severe at C3-C4 C5-C6 and C6-C7. Fusion of
the C2-C3 facet joints.

Upper chest: No consolidation within the imaged lung apices.
IMPRESSION: 1.4 cm lipoma overlying the right parotid gland. This is located
immediately deep to the skin surface marker and likely accounts for
the palpable finding. No parotid mass is demonstrated.

Mild mucosal thickening and small mucous retention cyst within the
right maxillary sinus.

Cervical spondylosis as described.

Aortic Atherosclerosis ([id number]). Calcified plaque is also
present within the proximal major branch vessels of the neck and
carotid bifurcations.

## 2022-09-20 ENCOUNTER — Other Ambulatory Visit (HOSPITAL_COMMUNITY): Payer: Self-pay

## 2022-09-20 ENCOUNTER — Encounter (HOSPITAL_COMMUNITY): Payer: Self-pay

## 2022-09-20 MED ORDER — MUPIROCIN 2 % EX OINT
1.0000 | TOPICAL_OINTMENT | Freq: Three times a day (TID) | CUTANEOUS | 2 refills | Status: AC | PRN
  Filled 2022-09-20: qty 66, 22d supply, fill #0

## 2022-09-21 ENCOUNTER — Other Ambulatory Visit (HOSPITAL_COMMUNITY): Payer: Self-pay

## 2022-09-21 ENCOUNTER — Other Ambulatory Visit: Payer: Self-pay

## 2022-09-28 ENCOUNTER — Other Ambulatory Visit: Payer: Self-pay

## 2022-09-28 ENCOUNTER — Other Ambulatory Visit (HOSPITAL_COMMUNITY): Payer: Self-pay

## 2022-09-28 MED ORDER — PREDNISONE 5 MG PO TABS
ORAL_TABLET | ORAL | 0 refills | Status: DC
  Filled 2022-09-28: qty 30, 90d supply, fill #0

## 2022-11-18 ENCOUNTER — Other Ambulatory Visit: Payer: Self-pay | Admitting: *Deleted

## 2022-11-18 ENCOUNTER — Ambulatory Visit: Payer: PPO | Attending: Internal Medicine

## 2022-11-18 DIAGNOSIS — R001 Bradycardia, unspecified: Secondary | ICD-10-CM

## 2022-11-18 NOTE — Progress Notes (Unsigned)
Enrolled for [employer] to mail a ZIO XT long term holter monitor to the patients address on file.   EP to read.

## 2022-11-23 DIAGNOSIS — R001 Bradycardia, unspecified: Secondary | ICD-10-CM | POA: Diagnosis not present

## 2022-12-02 ENCOUNTER — Other Ambulatory Visit (HOSPITAL_COMMUNITY): Payer: Self-pay

## 2022-12-02 MED ORDER — SODIUM FLUORIDE 1.1 % DT PSTE
PASTE | DENTAL | 99 refills | Status: AC
  Filled 2022-12-02: qty 100, 30d supply, fill #0
  Filled 2023-08-27: qty 100, 30d supply, fill #1

## 2022-12-03 ENCOUNTER — Other Ambulatory Visit (HOSPITAL_COMMUNITY): Payer: Self-pay

## 2022-12-03 ENCOUNTER — Other Ambulatory Visit: Payer: Self-pay

## 2022-12-07 ENCOUNTER — Other Ambulatory Visit: Payer: Self-pay

## 2022-12-11 ENCOUNTER — Other Ambulatory Visit (HOSPITAL_COMMUNITY): Payer: Self-pay

## 2022-12-14 ENCOUNTER — Telehealth: Payer: Self-pay | Admitting: Cardiology

## 2022-12-14 DIAGNOSIS — R001 Bradycardia, unspecified: Secondary | ICD-10-CM | POA: Diagnosis not present

## 2022-12-14 NOTE — Telephone Encounter (Signed)
Received a call from [employer], states that on 11-15 @ 443am pt reported low heart rate 38 BPM for 30 seconds. This is located on IRYTHM report on page 5 strip #1, per [employer]. I do not see this report in EPIC. Will await report in [facility].

## 2022-12-14 NOTE — Telephone Encounter (Signed)
[employer] calling with abnormal results. Please advise

## 2022-12-20 DIAGNOSIS — L570 Actinic keratosis: Secondary | ICD-10-CM | POA: Diagnosis not present

## 2022-12-20 DIAGNOSIS — L905 Scar conditions and fibrosis of skin: Secondary | ICD-10-CM | POA: Diagnosis not present

## 2022-12-20 DIAGNOSIS — D2261 Melanocytic nevi of right upper limb, including shoulder: Secondary | ICD-10-CM | POA: Diagnosis not present

## 2022-12-20 DIAGNOSIS — L814 Other melanin hyperpigmentation: Secondary | ICD-10-CM | POA: Diagnosis not present

## 2022-12-20 DIAGNOSIS — Z85828 Personal history of other malignant neoplasm of skin: Secondary | ICD-10-CM | POA: Diagnosis not present

## 2022-12-20 DIAGNOSIS — L821 Other seborrheic keratosis: Secondary | ICD-10-CM | POA: Diagnosis not present

## 2022-12-20 DIAGNOSIS — D2262 Melanocytic nevi of left upper limb, including shoulder: Secondary | ICD-10-CM | POA: Diagnosis not present

## 2022-12-20 DIAGNOSIS — D225 Melanocytic nevi of trunk: Secondary | ICD-10-CM | POA: Diagnosis not present

## 2023-01-24 DIAGNOSIS — Z125 Encounter for screening for malignant neoplasm of prostate: Secondary | ICD-10-CM | POA: Diagnosis not present

## 2023-01-24 DIAGNOSIS — I471 Supraventricular tachycardia, unspecified: Secondary | ICD-10-CM | POA: Diagnosis not present

## 2023-01-24 DIAGNOSIS — I10 Essential (primary) hypertension: Secondary | ICD-10-CM | POA: Diagnosis not present

## 2023-01-24 DIAGNOSIS — R7301 Impaired fasting glucose: Secondary | ICD-10-CM | POA: Diagnosis not present

## 2023-01-24 DIAGNOSIS — Z79899 Other long term (current) drug therapy: Secondary | ICD-10-CM | POA: Diagnosis not present

## 2023-01-31 DIAGNOSIS — R7301 Impaired fasting glucose: Secondary | ICD-10-CM | POA: Diagnosis not present

## 2023-01-31 DIAGNOSIS — I69959 Hemiplegia and hemiparesis following unspecified cerebrovascular disease affecting unspecified side: Secondary | ICD-10-CM | POA: Diagnosis not present

## 2023-01-31 DIAGNOSIS — I10 Essential (primary) hypertension: Secondary | ICD-10-CM | POA: Diagnosis not present

## 2023-01-31 DIAGNOSIS — Z9181 History of falling: Secondary | ICD-10-CM | POA: Diagnosis not present

## 2023-01-31 DIAGNOSIS — R82998 Other abnormal findings in urine: Secondary | ICD-10-CM | POA: Diagnosis not present

## 2023-01-31 DIAGNOSIS — Z1212 Encounter for screening for malignant neoplasm of rectum: Secondary | ICD-10-CM | POA: Diagnosis not present

## 2023-01-31 DIAGNOSIS — J309 Allergic rhinitis, unspecified: Secondary | ICD-10-CM | POA: Diagnosis not present

## 2023-01-31 DIAGNOSIS — R001 Bradycardia, unspecified: Secondary | ICD-10-CM | POA: Diagnosis not present

## 2023-01-31 DIAGNOSIS — Z1331 Encounter for screening for depression: Secondary | ICD-10-CM | POA: Diagnosis not present

## 2023-01-31 DIAGNOSIS — H409 Unspecified glaucoma: Secondary | ICD-10-CM | POA: Diagnosis not present

## 2023-01-31 DIAGNOSIS — I471 Supraventricular tachycardia, unspecified: Secondary | ICD-10-CM | POA: Diagnosis not present

## 2023-01-31 DIAGNOSIS — G4733 Obstructive sleep apnea (adult) (pediatric): Secondary | ICD-10-CM | POA: Diagnosis not present

## 2023-01-31 DIAGNOSIS — K5792 Diverticulitis of intestine, part unspecified, without perforation or abscess without bleeding: Secondary | ICD-10-CM | POA: Diagnosis not present

## 2023-01-31 DIAGNOSIS — Z0000 Encounter for general adult medical examination without abnormal findings: Secondary | ICD-10-CM | POA: Diagnosis not present

## 2023-01-31 DIAGNOSIS — Z1389 Encounter for screening for other disorder: Secondary | ICD-10-CM | POA: Diagnosis not present

## 2023-01-31 DIAGNOSIS — M353 Polymyalgia rheumatica: Secondary | ICD-10-CM | POA: Diagnosis not present

## 2023-02-22 DIAGNOSIS — I471 Supraventricular tachycardia, unspecified: Secondary | ICD-10-CM | POA: Diagnosis not present

## 2023-02-22 DIAGNOSIS — K2970 Gastritis, unspecified, without bleeding: Secondary | ICD-10-CM | POA: Diagnosis not present

## 2023-02-22 DIAGNOSIS — K922 Gastrointestinal hemorrhage, unspecified: Secondary | ICD-10-CM | POA: Diagnosis not present

## 2023-02-22 DIAGNOSIS — K5792 Diverticulitis of intestine, part unspecified, without perforation or abscess without bleeding: Secondary | ICD-10-CM | POA: Diagnosis not present

## 2023-02-22 DIAGNOSIS — Z1389 Encounter for screening for other disorder: Secondary | ICD-10-CM | POA: Diagnosis not present

## 2023-02-22 DIAGNOSIS — R1013 Epigastric pain: Secondary | ICD-10-CM | POA: Diagnosis not present

## 2023-02-22 DIAGNOSIS — I639 Cerebral infarction, unspecified: Secondary | ICD-10-CM | POA: Diagnosis not present

## 2023-02-22 DIAGNOSIS — I10 Essential (primary) hypertension: Secondary | ICD-10-CM | POA: Diagnosis not present

## 2023-02-22 DIAGNOSIS — E669 Obesity, unspecified: Secondary | ICD-10-CM | POA: Diagnosis not present

## 2023-02-22 DIAGNOSIS — R0602 Shortness of breath: Secondary | ICD-10-CM | POA: Diagnosis not present

## 2023-02-23 ENCOUNTER — Other Ambulatory Visit (HOSPITAL_COMMUNITY): Payer: Self-pay | Admitting: Internal Medicine

## 2023-02-23 ENCOUNTER — Encounter (HOSPITAL_COMMUNITY): Payer: Self-pay | Admitting: Internal Medicine

## 2023-02-23 DIAGNOSIS — K858 Other acute pancreatitis without necrosis or infection: Secondary | ICD-10-CM

## 2023-02-24 ENCOUNTER — Ambulatory Visit (HOSPITAL_COMMUNITY)
Admission: RE | Admit: 2023-02-24 | Discharge: 2023-02-24 | Disposition: A | Discharge status: Home / Self Care | Payer: PPO | Source: Ambulatory Visit | Attending: Internal Medicine | Admitting: Internal Medicine

## 2023-02-24 DIAGNOSIS — K858 Other acute pancreatitis without necrosis or infection: Secondary | ICD-10-CM | POA: Diagnosis not present

## 2023-02-24 DIAGNOSIS — N400 Enlarged prostate without lower urinary tract symptoms: Secondary | ICD-10-CM | POA: Diagnosis not present

## 2023-02-24 DIAGNOSIS — N281 Cyst of kidney, acquired: Secondary | ICD-10-CM | POA: Diagnosis not present

## 2023-02-24 DIAGNOSIS — K5730 Diverticulosis of large intestine without perforation or abscess without bleeding: Secondary | ICD-10-CM | POA: Diagnosis not present

## 2023-02-24 MED ORDER — IOHEXOL 350 MG/ML SOLN
75.0000 mL | Freq: Once | INTRAVENOUS | Status: AC | PRN
  Administered 2023-02-24: 75 mL via INTRAVENOUS

## 2023-02-25 DIAGNOSIS — K2970 Gastritis, unspecified, without bleeding: Secondary | ICD-10-CM | POA: Diagnosis not present

## 2023-02-25 DIAGNOSIS — R7989 Other specified abnormal findings of blood chemistry: Secondary | ICD-10-CM | POA: Diagnosis not present

## 2023-02-25 DIAGNOSIS — I471 Supraventricular tachycardia, unspecified: Secondary | ICD-10-CM | POA: Diagnosis not present

## 2023-02-25 DIAGNOSIS — K859 Acute pancreatitis without necrosis or infection, unspecified: Secondary | ICD-10-CM | POA: Diagnosis not present

## 2023-02-25 DIAGNOSIS — I10 Essential (primary) hypertension: Secondary | ICD-10-CM | POA: Diagnosis not present

## 2023-02-25 DIAGNOSIS — E669 Obesity, unspecified: Secondary | ICD-10-CM | POA: Diagnosis not present

## 2023-02-25 DIAGNOSIS — R0602 Shortness of breath: Secondary | ICD-10-CM | POA: Diagnosis not present

## 2023-02-25 DIAGNOSIS — M353 Polymyalgia rheumatica: Secondary | ICD-10-CM | POA: Diagnosis not present

## 2023-02-25 DIAGNOSIS — R1013 Epigastric pain: Secondary | ICD-10-CM | POA: Diagnosis not present

## 2023-02-25 DIAGNOSIS — K5792 Diverticulitis of intestine, part unspecified, without perforation or abscess without bleeding: Secondary | ICD-10-CM | POA: Diagnosis not present

## 2023-02-25 DIAGNOSIS — N3289 Other specified disorders of bladder: Secondary | ICD-10-CM | POA: Diagnosis not present

## 2023-03-03 ENCOUNTER — Other Ambulatory Visit (HOSPITAL_COMMUNITY): Payer: Self-pay

## 2023-03-03 ENCOUNTER — Other Ambulatory Visit: Payer: Self-pay

## 2023-03-04 ENCOUNTER — Other Ambulatory Visit: Payer: Self-pay

## 2023-03-04 ENCOUNTER — Other Ambulatory Visit (HOSPITAL_COMMUNITY): Payer: Self-pay

## 2023-03-04 DIAGNOSIS — Z961 Presence of intraocular lens: Secondary | ICD-10-CM | POA: Diagnosis not present

## 2023-03-04 DIAGNOSIS — H40012 Open angle with borderline findings, low risk, left eye: Secondary | ICD-10-CM | POA: Diagnosis not present

## 2023-03-04 DIAGNOSIS — H52203 Unspecified astigmatism, bilateral: Secondary | ICD-10-CM | POA: Diagnosis not present

## 2023-03-04 MED ORDER — AMLODIPINE BESYLATE 5 MG PO TABS
5.0000 mg | ORAL_TABLET | Freq: Every day | ORAL | 3 refills | Status: DC
  Filled 2023-03-04: qty 100, 100d supply, fill #0
  Filled 2023-03-08: qty 30, 30d supply, fill #0
  Filled 2023-03-21 – 2023-03-31 (×2): qty 30, 30d supply, fill #1

## 2023-03-04 MED ORDER — LISINOPRIL-HYDROCHLOROTHIAZIDE 20-25 MG PO TABS
1.0000 | ORAL_TABLET | Freq: Every day | ORAL | 3 refills | Status: AC
  Filled 2023-03-04: qty 100, 100d supply, fill #0
  Filled 2023-03-08: qty 30, 30d supply, fill #0
  Filled 2023-03-21 – 2023-03-31 (×2): qty 30, 30d supply, fill #1
  Filled 2023-08-27: qty 100, 100d supply, fill #2
  Filled 2023-12-18: qty 100, 100d supply, fill #3

## 2023-03-04 MED ORDER — ATORVASTATIN CALCIUM 10 MG PO TABS
10.0000 mg | ORAL_TABLET | Freq: Every day | ORAL | 3 refills | Status: AC
  Filled 2023-03-04: qty 100, 100d supply, fill #0
  Filled 2023-03-08: qty 30, 30d supply, fill #0
  Filled 2023-03-21 – 2023-03-31 (×2): qty 30, 30d supply, fill #1
  Filled 2023-08-27: qty 30, 30d supply, fill #2
  Filled 2023-08-29: qty 100, 100d supply, fill #2
  Filled 2023-12-18: qty 100, 100d supply, fill #3

## 2023-03-04 MED ORDER — OMEPRAZOLE 40 MG PO CPDR
40.0000 mg | DELAYED_RELEASE_CAPSULE | Freq: Every day | ORAL | 3 refills | Status: AC
  Filled 2023-03-04: qty 100, 100d supply, fill #0
  Filled 2023-03-08: qty 30, 30d supply, fill #0
  Filled 2023-03-21 – 2023-03-31 (×2): qty 30, 30d supply, fill #1
  Filled 2023-10-10: qty 30, 30d supply, fill #2
  Filled 2023-11-23: qty 30, 30d supply, fill #3

## 2023-03-08 ENCOUNTER — Other Ambulatory Visit: Payer: Self-pay

## 2023-03-08 ENCOUNTER — Other Ambulatory Visit (HOSPITAL_COMMUNITY): Payer: Self-pay

## 2023-03-09 ENCOUNTER — Other Ambulatory Visit: Payer: Self-pay

## 2023-03-14 ENCOUNTER — Other Ambulatory Visit: Payer: Self-pay

## 2023-03-21 ENCOUNTER — Other Ambulatory Visit: Payer: Self-pay

## 2023-03-31 ENCOUNTER — Other Ambulatory Visit: Payer: Self-pay

## 2023-04-04 ENCOUNTER — Other Ambulatory Visit: Payer: Self-pay

## 2023-04-04 ENCOUNTER — Other Ambulatory Visit (HOSPITAL_COMMUNITY): Payer: Self-pay

## 2023-05-19 ENCOUNTER — Other Ambulatory Visit (HOSPITAL_COMMUNITY): Payer: Self-pay

## 2023-05-19 ENCOUNTER — Other Ambulatory Visit: Payer: Self-pay

## 2023-05-19 MED ORDER — OMEPRAZOLE 40 MG PO CPDR
40.0000 mg | DELAYED_RELEASE_CAPSULE | Freq: Every day | ORAL | 3 refills | Status: DC
  Filled 2023-05-19: qty 100, 100d supply, fill #0

## 2023-05-19 MED ORDER — ATORVASTATIN CALCIUM 10 MG PO TABS
10.0000 mg | ORAL_TABLET | Freq: Every day | ORAL | 3 refills | Status: DC
  Filled 2023-05-19: qty 100, 100d supply, fill #0

## 2023-05-19 MED ORDER — LISINOPRIL-HYDROCHLOROTHIAZIDE 20-25 MG PO TABS
1.0000 | ORAL_TABLET | Freq: Every day | ORAL | 3 refills | Status: DC
  Filled 2023-05-19: qty 100, 100d supply, fill #0

## 2023-05-19 MED ORDER — AMLODIPINE BESYLATE 5 MG PO TABS
5.0000 mg | ORAL_TABLET | Freq: Every day | ORAL | 3 refills | Status: AC
  Filled 2023-05-19: qty 100, 100d supply, fill #0
  Filled 2023-08-27: qty 100, 100d supply, fill #1
  Filled 2023-12-18: qty 100, 100d supply, fill #2

## 2023-05-26 NOTE — Progress Notes (Signed)
 Marvin Zamora

## 2023-05-27 ENCOUNTER — Encounter: Payer: Self-pay | Admitting: Gastroenterology

## 2023-05-27 ENCOUNTER — Ambulatory Visit: Admitting: Gastroenterology

## 2023-05-27 VITALS — BP 134/72 | HR 64 | Ht 72.0 in | Wt 225.4 lb

## 2023-05-27 DIAGNOSIS — R933 Abnormal findings on diagnostic imaging of other parts of digestive tract: Secondary | ICD-10-CM | POA: Diagnosis not present

## 2023-05-27 DIAGNOSIS — F1090 Alcohol use, unspecified, uncomplicated: Secondary | ICD-10-CM

## 2023-05-27 DIAGNOSIS — K859 Acute pancreatitis without necrosis or infection, unspecified: Secondary | ICD-10-CM | POA: Diagnosis not present

## 2023-05-27 NOTE — Patient Instructions (Signed)
 You have been scheduled for an MRI at [facility] (Entrance A) on Friday 06/03/23. Your appointment time is 10 am. Please arrive to admitting (at main entrance of the hospital) 30 minutes prior to your appointment time for registration purposes. Please make certain not to have anything to eat or drink 6 hours prior to your test. In addition, if you have any metal in your body, have a pacemaker or defibrillator, please be sure to let your ordering physician know. This test typically takes 45 minutes to 1 hour to complete. Should you need to reschedule, please call [phone] to do so.  You have been scheduled for an endoscopy. Please follow written instructions given to you at your visit today.  If you use inhalers (even only as needed), please bring them with you on the day of your procedure.  If you take any of the following medications, they will need to be adjusted prior to your procedure:   DO NOT TAKE 7 DAYS PRIOR TO TEST- Trulicity (dulaglutide) Ozempic, Wegovy (semaglutide) Mounjaro (tirzepatide) Bydureon Bcise (exanatide extended release)  DO NOT TAKE 1 DAY PRIOR TO YOUR TEST Rybelsus (semaglutide) Adlyxin (lixisenatide) Victoza (liraglutide) Byetta (exanatide) _______________________________________________________________  _______________________________________________________  If your blood pressure at your visit was 140/90 or greater, please contact your primary care physician to follow up on this.  _______________________________________________________  If you are age 80 or older, your body mass index should be between 23-30. Your Body mass index is 30.57 kg/m. If this is out of the aforementioned range listed, please consider follow up with your Primary Care Provider.  If you are age 38 or younger, your body mass index should be between 19-25. Your Body mass index is 30.57 kg/m. If this is out of the aformentioned range listed, please consider follow up with  your Primary Care Provider.   ________________________________________________________  The [facility] providers would like to encourage you to use MYCHART to communicate with providers for non-urgent requests or questions.  Due to long hold times on the telephone, sending your provider a message by [facility] may be a faster and more efficient way to get a response.  Please allow 48 business hours for a response.  Please remember that this is for non-urgent requests.  _______________________________________________________
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
## 2023-05-29 NOTE — Progress Notes (Signed)
 ____________________________________________________________  Attending physician addendum:  Thank you for sending this case to me. I have reviewed the entire note and agree with the plan.  Reportedly mild and nonspecific gastric finding on CTAP.  Agree EGD is warranted.  Agree he does not need further pancreatic imaging at this time, and he would benefit from decreasing alcohol  consumption.  Lorella Roles, MD  ____________________________________________________________
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
## 2023-05-30 ENCOUNTER — Ambulatory Visit: Payer: PPO | Admitting: Adult Health

## 2023-05-30 VITALS — BP 130/56 | HR 59 | Ht 72.0 in | Wt 224.4 lb

## 2023-05-30 DIAGNOSIS — G4733 Obstructive sleep apnea (adult) (pediatric): Secondary | ICD-10-CM | POA: Diagnosis not present

## 2023-05-30 NOTE — Patient Instructions (Addendum)
 Continue using CPAP nightly and greater than 4 hours each night Will change pressure to autoset 7-15 If your symptoms worsen or you develop new symptoms please let us  know.

## 2023-06-03 ENCOUNTER — Ambulatory Visit (HOSPITAL_COMMUNITY)
Admission: RE | Admit: 2023-06-03 | Discharge: 2023-06-03 | Disposition: A | Discharge status: Home / Self Care | Source: Ambulatory Visit | Attending: Gastroenterology | Admitting: Gastroenterology

## 2023-06-03 ENCOUNTER — Other Ambulatory Visit: Payer: Self-pay | Admitting: Gastroenterology

## 2023-06-03 DIAGNOSIS — K859 Acute pancreatitis without necrosis or infection, unspecified: Secondary | ICD-10-CM | POA: Insufficient documentation

## 2023-06-03 DIAGNOSIS — K5730 Diverticulosis of large intestine without perforation or abscess without bleeding: Secondary | ICD-10-CM | POA: Diagnosis not present

## 2023-06-03 DIAGNOSIS — R933 Abnormal findings on diagnostic imaging of other parts of digestive tract: Secondary | ICD-10-CM | POA: Diagnosis not present

## 2023-06-03 MED ORDER — GADOBUTROL 1 MMOL/ML IV SOLN
10.0000 mL | Freq: Once | INTRAVENOUS | Status: AC | PRN
Start: 2023-06-03 — End: 2023-06-03
  Administered 2023-06-03: 10 mL via INTRAVENOUS

## 2023-06-13 ENCOUNTER — Ambulatory Visit: Payer: Self-pay | Admitting: Gastroenterology

## 2023-06-14 ENCOUNTER — Encounter: Payer: Self-pay | Admitting: Gastroenterology

## 2023-06-15 ENCOUNTER — Telehealth: Payer: Self-pay | Admitting: *Deleted

## 2023-06-15 NOTE — Telephone Encounter (Signed)
 Thank you for the note.  While I agree with Camilo Cella that we do not highly suspect this to be a time sensitive finding of a questionable stomach abnormality on the CT scan, I would prefer to get it done sooner than August 28 if possible.  I believe that would give the patient peace of mind.  Because of the frequent cancellations and no-shows on hospital outpatient blocks, Dr. General Kenner messaged all of us  over a month ago indicating that we could overbook each of those blocks with 1 case (either an EGD or colonoscopy)  This would be a good situation for that.  Looking at my current hospital outpatient blocks, I think it would be appropriate to do so on June 26 or July 21.  If you have questions or concerns about how to do that with scheduling in the hospital endoscopy lab, please see Sherri, as we recently had discussions about it.  - H. Dominic Friendly, MD

## 2023-06-15 NOTE — Telephone Encounter (Signed)
 Called patient and notified him of changed date and time. New instructions sent via My Chart per patient request. Patient verbalized understanding.

## 2023-06-15 NOTE — Telephone Encounter (Signed)
 Patient rescheduled for 06/20/2023 for 10:30 start time and 09:00 AM arrival time. Patient notified and new instructions sent via my chart.

## 2023-06-15 NOTE — Telephone Encounter (Signed)
 On closer review, it appears that there is actually an opening next Monday, June 9th from 8:15 -8:30, which would accommodate this EGD.  Please work on that.  - H. Dominic Friendly, MD

## 2023-06-15 NOTE — Telephone Encounter (Signed)
 Dr. Dominic Friendly,  On 07/23/20 this pt's anesthesia dx'd him as a difficult intubation, consequently  his procedure will need to be done at the hospital.   Best regards,  Cathryn Cobb

## 2023-06-15 NOTE — Telephone Encounter (Signed)
 Rescheduled patient for [facility]. First available is not until 09/08/2023 at 09:30 AM. Sending to provider to verify that this time frame is acceptable r/t diagnosis of abnormal CT of abdomen.

## 2023-06-20 ENCOUNTER — Ambulatory Visit (HOSPITAL_COMMUNITY): Payer: Self-pay | Admitting: Anesthesiology

## 2023-06-20 ENCOUNTER — Encounter (HOSPITAL_COMMUNITY)
Admission: RE | Disposition: A | Discharge status: Home / Self Care | Payer: Self-pay | Source: Home / Self Care | Attending: Gastroenterology

## 2023-06-20 ENCOUNTER — Other Ambulatory Visit: Payer: Self-pay

## 2023-06-20 ENCOUNTER — Ambulatory Visit (HOSPITAL_COMMUNITY)
Admission: RE | Admit: 2023-06-20 | Discharge: 2023-06-20 | Disposition: A | Discharge status: Home / Self Care | Attending: Gastroenterology | Admitting: Gastroenterology

## 2023-06-20 ENCOUNTER — Encounter: Admitting: Gastroenterology

## 2023-06-20 ENCOUNTER — Encounter (HOSPITAL_COMMUNITY): Payer: Self-pay | Admitting: Gastroenterology

## 2023-06-20 DIAGNOSIS — R933 Abnormal findings on diagnostic imaging of other parts of digestive tract: Secondary | ICD-10-CM | POA: Diagnosis not present

## 2023-06-20 DIAGNOSIS — I10 Essential (primary) hypertension: Secondary | ICD-10-CM

## 2023-06-20 DIAGNOSIS — G4730 Sleep apnea, unspecified: Secondary | ICD-10-CM | POA: Insufficient documentation

## 2023-06-20 DIAGNOSIS — G4733 Obstructive sleep apnea (adult) (pediatric): Secondary | ICD-10-CM

## 2023-06-20 DIAGNOSIS — D131 Benign neoplasm of stomach: Secondary | ICD-10-CM | POA: Diagnosis not present

## 2023-06-20 DIAGNOSIS — K317 Polyp of stomach and duodenum: Secondary | ICD-10-CM | POA: Insufficient documentation

## 2023-06-20 DIAGNOSIS — K219 Gastro-esophageal reflux disease without esophagitis: Secondary | ICD-10-CM | POA: Diagnosis not present

## 2023-06-20 DIAGNOSIS — I6350 Cerebral infarction due to unspecified occlusion or stenosis of unspecified cerebral artery: Secondary | ICD-10-CM

## 2023-06-20 DIAGNOSIS — Z87891 Personal history of nicotine dependence: Secondary | ICD-10-CM | POA: Diagnosis not present

## 2023-06-20 HISTORY — PX: ESOPHAGOGASTRODUODENOSCOPY: SHX5428

## 2023-06-20 SURGERY — EGD (ESOPHAGOGASTRODUODENOSCOPY)
Anesthesia: Monitor Anesthesia Care

## 2023-06-20 MED ORDER — PROPOFOL 10 MG/ML IV BOLUS
INTRAVENOUS | Status: AC
  Filled 2023-06-20: qty 20

## 2023-06-20 MED ORDER — SODIUM CHLORIDE 0.9 % IV SOLN: INTRAVENOUS | Status: DC

## 2023-06-20 MED ORDER — PROPOFOL 10 MG/ML IV BOLUS
INTRAVENOUS | Status: DC | PRN
Start: 2023-06-20 — End: 2023-06-20
  Administered 2023-06-20: 40 mg via INTRAVENOUS
  Administered 2023-06-20: 120 mg via INTRAVENOUS
  Administered 2023-06-20 (×2): 40 mg via INTRAVENOUS

## 2023-06-20 MED ORDER — LIDOCAINE 2% (20 MG/ML) 5 ML SYRINGE
INTRAMUSCULAR | Status: DC | PRN
  Administered 2023-06-20: 50 mg via INTRAVENOUS

## 2023-06-20 NOTE — Anesthesia Postprocedure Evaluation (Signed)
 Anesthesia Post Note  Patient: Marvin Zamora.  Procedure(s) Performed: EGD (ESOPHAGOGASTRODUODENOSCOPY)     Patient location during evaluation: Endoscopy Anesthesia Type: MAC Level of consciousness: awake and alert Pain management: pain level controlled Vital Signs Assessment: post-procedure vital signs reviewed and stable Respiratory status: spontaneous breathing, nonlabored ventilation and respiratory function stable Cardiovascular status: stable and blood pressure returned to baseline Postop Assessment: no apparent nausea or vomiting Anesthetic complications: no  No notable events documented.  Last Vitals:  Vitals:   06/20/23 1050 06/20/23 1100  BP: (!) 161/65 (!) 166/75  Pulse: 60 (!) 56  Resp: 12 14  Temp:    SpO2: 100% 97%    Last Pain:  Vitals:   06/20/23 1100  TempSrc:   PainSc: 0-No pain                 Kam Rahimi,W. EDMOND

## 2023-06-20 NOTE — Discharge Instructions (Signed)

## 2023-06-20 NOTE — Anesthesia Preprocedure Evaluation (Addendum)
 Anesthesia Evaluation  Patient identified by MRN, date of birth, ID band Patient awake    Reviewed: Allergy & Precautions, H&P , NPO status , Patient's Chart, lab work & pertinent test results  Airway Mallampati: IV  TM Distance: >3 FB Neck ROM: Full    Dental no notable dental hx. (+) Teeth Intact, Dental Advisory Given   Pulmonary sleep apnea and Continuous Positive Airway Pressure Ventilation , former smoker   Pulmonary exam normal breath sounds clear to auscultation       Cardiovascular hypertension, Pt. on medications  Rhythm:Regular Rate:Normal     Neuro/Psych CVA, No Residual Symptoms  negative psych ROS   GI/Hepatic negative GI ROS, Neg liver ROS,,,  Endo/Other  negative endocrine ROS    Renal/GU negative Renal ROS  negative genitourinary   Musculoskeletal   Abdominal   Peds  Hematology negative hematology ROS (+)   Anesthesia Other Findings   Reproductive/Obstetrics negative OB ROS                             Anesthesia Physical Anesthesia Plan  ASA: 3  Anesthesia Plan: MAC   Post-op Pain Management: Minimal or no pain anticipated   Induction: Intravenous  PONV Risk Score and Plan: 1 and Propofol  infusion  Airway Management Planned: Natural Airway and Simple Face Mask  Additional Equipment:   Intra-op Plan:   Post-operative Plan:   Informed Consent: I have reviewed the patients History and Physical, chart, labs and discussed the procedure including the risks, benefits and alternatives for the proposed anesthesia with the patient or authorized representative who has indicated his/her understanding and acceptance.     Dental advisory given  Plan Discussed with: CRNA and Surgeon  Anesthesia Plan Comments:        Anesthesia Quick Evaluation

## 2023-06-20 NOTE — Interval H&P Note (Signed)
 History and Physical Interval Note:  06/20/2023 10:17 AM  Marvin Zamora.  has presented today for surgery, with the diagnosis of Abnormal CT scan, stomach.  The various methods of treatment have been discussed with the patient and family. After consideration of risks, benefits and other options for treatment, the patient has consented to  Procedure(s): EGD (ESOPHAGOGASTRODUODENOSCOPY) (N/A) as a surgical intervention.  The patient's history has been reviewed, patient examined, no change in status, stable for surgery.  I have reviewed the patient's chart and labs.  Questions were answered to the patient's satisfaction.     Kerby Pearson III

## 2023-06-20 NOTE — Op Note (Signed)
 [facility] Patient Name: Marvin Zamora Procedure Date: 06/20/2023 MRN: [id number] Attending MD: Roel Clarity. Dominic Friendly , MD, [id number] Date of Birth: 1945/06/19 CSN: [id number] Age: 78 Admit Type: Outpatient Procedure:                Upper GI endoscopy Indications:              Esophageal reflux, Abnormal CT of the GI tract                           See office H&P and today's H&P for further clinical                            details.                           Patient reports he has been on daily omeprazole                             since an upper endoscopy in September 2014 done for                            question of GI bleeding. That exam had mild distal                            esophagitis.                           He denies chronic pyrosis or regurgitation. Providers:                Roel Clarity. Dominic Friendly, MD, Bradley Caffey, Joline Ned,                            Technician Referring MD:             Ma Saupe Perini, MD Medicines:                Monitored Anesthesia Care Complications:            No immediate complications. Estimated Blood Loss:     Estimated blood loss: none. Procedure:                Pre-Anesthesia Assessment:                           - Prior to the procedure, a History and Physical                            was performed, and patient medications and                            allergies were reviewed. The patient's tolerance of                            previous anesthesia was also reviewed. The risks                            and benefits  of the procedure and the sedation                            options and risks were discussed with the patient.                            All questions were answered, and informed consent                            was obtained. Prior Anticoagulants: The patient has                            taken no anticoagulant or antiplatelet agents. ASA                            Grade Assessment: II - A patient with  mild systemic                            disease. After reviewing the risks and benefits,                            the patient was deemed in satisfactory condition to                            undergo the procedure.                           After obtaining informed consent, the endoscope was                            passed under direct vision. Throughout the                            procedure, the patient's blood pressure, pulse, and                            oxygen saturations were monitored continuously. The                            [id number]-H190 ([id number]) Olympus endoscope was introduced                            through the mouth, and advanced to the second part                            of duodenum. The upper GI endoscopy was                            accomplished without difficulty. The patient                            tolerated the procedure well. Scope In: Scope Out: Findings:      The esophagus was normal. EGJ somewhat patulous. No Barrett's mucosa,  esophagitis or stricture seen.      The cardia and gastric fundus were normal on retroflexion.      Multiple small sessile fundic gland polyps were found in the gastric       fundus and in the gastric body.      The exam of the stomach was otherwise normal.      The examined duodenum was normal. Impression:               - Normal esophagus.                           - Multiple fundic gland polyps.                           - Normal examined duodenum.                           - No specimens collected. Moderate Sedation:      MAC sedation used Recommendation:           - Patient has a contact number available for                            emergencies. The signs and symptoms of potential                            delayed complications were discussed with the                            patient. Return to normal activities tomorrow.                            Written discharge instructions were provided to the                             patient.                           - Resume previous diet.                           - Continue present medications. Regarding                            omeprazole , I recommend decreasing it to 1 tablet                            every other day for the next 7 to 10 days. If no                            reflux symptoms of heartburn or regurgitation,                            discontinue that medicine and change over to                            over-the-counter  famotidine 20 mg once daily for 10                            to 14 days. If not experiencing regurgitation or                            heartburn at that point, discontinue that medicine.                            Famotidine could then be used as needed for reflux                            symptoms.                           - Return to my office as needed, particularly if                            further advice needed regarding any symptoms of                            GERD that may recur with the above medical                            management. Procedure Code(s):        --- Professional ---                           [id number], Esophagogastroduodenoscopy, flexible,                            transoral; diagnostic, including collection of                            specimen(s) by brushing or washing, when performed                            (separate procedure) Diagnosis Code(s):        --- Professional ---                           K21.9, Gastro-esophageal reflux disease without                            esophagitis                           R93.3, Abnormal findings on diagnostic imaging of                            other parts of digestive tract CPT copyright 2022 [employer]. All rights reserved. The codes documented in this report are preliminary and upon coder review may  be revised to meet current compliance requirements. Kylie Gros L. Dominic Friendly, MD 06/20/2023 10:44:47 AM This  report has been signed electronically. Number of Addenda: 0

## 2023-06-20 NOTE — Transfer of Care (Signed)
 Immediate Anesthesia Transfer of Care Note  Patient: Marvin Zamora.  Procedure(s) Performed: EGD (ESOPHAGOGASTRODUODENOSCOPY)  Patient Location: Endoscopy Unit  Anesthesia Type:MAC  Level of Consciousness: awake, alert , oriented, and patient cooperative  Airway & Oxygen Therapy: Patient Spontanous Breathing and Patient connected to face mask oxygen  Post-op Assessment: Report given to RN and Post -op Vital signs reviewed and stable  Post vital signs: Reviewed and stable  Last Vitals:  Vitals Value Taken Time  BP 140/69 06/20/23 1040  Temp    Pulse 59 06/20/23 1042  Resp 21 06/20/23 1042  SpO2 98 % 06/20/23 1042  Vitals shown include unfiled device data.  Last Pain:  Vitals:   06/20/23 0918  TempSrc: Temporal  PainSc: 0-No pain      Patients Stated Pain Goal: 0 (06/20/23 1610)  Complications: No notable events documented.
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
## 2023-06-20 NOTE — Anesthesia Procedure Notes (Signed)
 Procedure Name: MAC Date/Time: 06/20/2023 10:21 AM  Performed by: Mervyn Ace, CRNAPre-anesthesia Checklist: Patient identified, Emergency Drugs available, Suction available, Patient being monitored and Timeout performed Patient Re-evaluated:Patient Re-evaluated prior to induction Oxygen Delivery Method: Simple face mask Preoxygenation: Pre-oxygenation with 100% oxygen (POM mask utilized) Induction Type: IV induction Dental Injury: Teeth and Oropharynx as per pre-operative assessment

## 2023-06-23 ENCOUNTER — Encounter (HOSPITAL_COMMUNITY): Payer: Self-pay | Admitting: Gastroenterology

## 2023-07-12 DIAGNOSIS — M7021 Olecranon bursitis, right elbow: Secondary | ICD-10-CM | POA: Diagnosis not present

## 2023-07-26 DIAGNOSIS — M7021 Olecranon bursitis, right elbow: Secondary | ICD-10-CM | POA: Diagnosis not present

## 2023-07-26 DIAGNOSIS — M25521 Pain in right elbow: Secondary | ICD-10-CM | POA: Diagnosis not present

## 2023-08-29 ENCOUNTER — Other Ambulatory Visit: Payer: Self-pay

## 2023-08-29 ENCOUNTER — Other Ambulatory Visit (HOSPITAL_COMMUNITY): Payer: Self-pay

## 2023-08-30 ENCOUNTER — Other Ambulatory Visit: Payer: Self-pay

## 2023-08-31 ENCOUNTER — Encounter: Payer: Self-pay | Admitting: Pharmacist

## 2023-08-31 ENCOUNTER — Other Ambulatory Visit: Payer: Self-pay

## 2023-09-01 ENCOUNTER — Other Ambulatory Visit (HOSPITAL_COMMUNITY): Payer: Self-pay

## 2023-09-05 DIAGNOSIS — J309 Allergic rhinitis, unspecified: Secondary | ICD-10-CM | POA: Diagnosis not present

## 2023-09-05 DIAGNOSIS — E669 Obesity, unspecified: Secondary | ICD-10-CM | POA: Diagnosis not present

## 2023-09-05 DIAGNOSIS — G4733 Obstructive sleep apnea (adult) (pediatric): Secondary | ICD-10-CM | POA: Diagnosis not present

## 2023-09-05 DIAGNOSIS — I471 Supraventricular tachycardia, unspecified: Secondary | ICD-10-CM | POA: Diagnosis not present

## 2023-09-05 DIAGNOSIS — K859 Acute pancreatitis without necrosis or infection, unspecified: Secondary | ICD-10-CM | POA: Diagnosis not present

## 2023-09-05 DIAGNOSIS — K5792 Diverticulitis of intestine, part unspecified, without perforation or abscess without bleeding: Secondary | ICD-10-CM | POA: Diagnosis not present

## 2023-09-05 DIAGNOSIS — M353 Polymyalgia rheumatica: Secondary | ICD-10-CM | POA: Diagnosis not present

## 2023-09-05 DIAGNOSIS — K2970 Gastritis, unspecified, without bleeding: Secondary | ICD-10-CM | POA: Diagnosis not present

## 2023-09-05 DIAGNOSIS — R7301 Impaired fasting glucose: Secondary | ICD-10-CM | POA: Diagnosis not present

## 2023-09-05 DIAGNOSIS — I69959 Hemiplegia and hemiparesis following unspecified cerebrovascular disease affecting unspecified side: Secondary | ICD-10-CM | POA: Diagnosis not present

## 2023-09-05 DIAGNOSIS — I10 Essential (primary) hypertension: Secondary | ICD-10-CM | POA: Diagnosis not present

## 2023-09-05 DIAGNOSIS — H409 Unspecified glaucoma: Secondary | ICD-10-CM | POA: Diagnosis not present

## 2023-09-05 DIAGNOSIS — M8589 Other specified disorders of bone density and structure, multiple sites: Secondary | ICD-10-CM | POA: Diagnosis not present

## 2023-10-10 ENCOUNTER — Other Ambulatory Visit: Payer: Self-pay

## 2023-11-17 NOTE — Progress Notes (Addendum)
 Marvin Zamora.                                          MRN: [id number]   01/19/2024   The [facility] Quality Team Specialist reviewed this patient medical record for the purposes of chart review for care gap closure. The following were reviewed: chart review for care gap closure-controlling blood pressure.    [facility] Quality Team

## 2023-11-23 ENCOUNTER — Other Ambulatory Visit (HOSPITAL_BASED_OUTPATIENT_CLINIC_OR_DEPARTMENT_OTHER): Payer: Self-pay

## 2023-11-28 ENCOUNTER — Other Ambulatory Visit (HOSPITAL_COMMUNITY): Payer: Self-pay

## 2023-11-28 MED ORDER — OMEPRAZOLE 40 MG PO CPDR
40.0000 mg | DELAYED_RELEASE_CAPSULE | Freq: Every day | ORAL | 3 refills | Status: AC
  Filled 2023-12-18: qty 100, 100d supply, fill #0

## 2023-12-18 ENCOUNTER — Other Ambulatory Visit (HOSPITAL_COMMUNITY): Payer: Self-pay

## 2023-12-19 ENCOUNTER — Other Ambulatory Visit: Payer: Self-pay

## 2023-12-22 DIAGNOSIS — D2261 Melanocytic nevi of right upper limb, including shoulder: Secondary | ICD-10-CM | POA: Diagnosis not present

## 2023-12-22 DIAGNOSIS — L723 Sebaceous cyst: Secondary | ICD-10-CM | POA: Diagnosis not present

## 2023-12-22 DIAGNOSIS — D485 Neoplasm of uncertain behavior of skin: Secondary | ICD-10-CM | POA: Diagnosis not present

## 2023-12-22 DIAGNOSIS — Z85828 Personal history of other malignant neoplasm of skin: Secondary | ICD-10-CM | POA: Diagnosis not present

## 2023-12-22 DIAGNOSIS — D2262 Melanocytic nevi of left upper limb, including shoulder: Secondary | ICD-10-CM | POA: Diagnosis not present

## 2023-12-22 DIAGNOSIS — L905 Scar conditions and fibrosis of skin: Secondary | ICD-10-CM | POA: Diagnosis not present

## 2023-12-22 DIAGNOSIS — L821 Other seborrheic keratosis: Secondary | ICD-10-CM | POA: Diagnosis not present

## 2023-12-22 DIAGNOSIS — D225 Melanocytic nevi of trunk: Secondary | ICD-10-CM | POA: Diagnosis not present

## 2023-12-22 DIAGNOSIS — L570 Actinic keratosis: Secondary | ICD-10-CM | POA: Diagnosis not present

## 2024-06-05 ENCOUNTER — Ambulatory Visit: Admitting: Adult Health
